# Patient Record
Sex: Female | Born: 1980 | Race: White | Hispanic: No | Marital: Married | Smoking: Former smoker
Health system: Southern US, Community
[De-identification: ages and names within clinical notes are randomized; demographics above are authoritative.]

## PROBLEM LIST (undated history)

## (undated) ENCOUNTER — Inpatient Hospital Stay (HOSPITAL_COMMUNITY): Payer: Self-pay

## (undated) DIAGNOSIS — O039 Complete or unspecified spontaneous abortion without complication: Secondary | ICD-10-CM

## (undated) DIAGNOSIS — R87619 Unspecified abnormal cytological findings in specimens from cervix uteri: Secondary | ICD-10-CM

## (undated) DIAGNOSIS — E7800 Pure hypercholesterolemia, unspecified: Secondary | ICD-10-CM

## (undated) DIAGNOSIS — O139 Gestational [pregnancy-induced] hypertension without significant proteinuria, unspecified trimester: Secondary | ICD-10-CM

## (undated) DIAGNOSIS — I10 Essential (primary) hypertension: Secondary | ICD-10-CM

## (undated) DIAGNOSIS — F419 Anxiety disorder, unspecified: Secondary | ICD-10-CM

## (undated) HISTORY — DX: Anxiety disorder, unspecified: F41.9

## (undated) HISTORY — DX: Pure hypercholesterolemia, unspecified: E78.00

## (undated) HISTORY — DX: Essential (primary) hypertension: I10

## (undated) HISTORY — PX: COLPOSCOPY: SHX161

## (undated) HISTORY — DX: Complete or unspecified spontaneous abortion without complication: O03.9

## (undated) HISTORY — DX: Gestational (pregnancy-induced) hypertension without significant proteinuria, unspecified trimester: O13.9

## (undated) HISTORY — PX: TOE SURGERY: SHX1073

## (undated) HISTORY — PX: TONSILLECTOMY: SUR1361

## (undated) HISTORY — DX: Unspecified abnormal cytological findings in specimens from cervix uteri: R87.619

## (undated) HISTORY — PX: CARPAL TUNNEL RELEASE: SHX101

## (undated) HISTORY — PX: TONSILLECTOMY AND ADENOIDECTOMY: SHX28

---

## 2014-01-16 ENCOUNTER — Encounter: Payer: Self-pay | Admitting: Obstetrics and Gynecology

## 2014-01-16 ENCOUNTER — Ambulatory Visit (INDEPENDENT_AMBULATORY_CARE_PROVIDER_SITE_OTHER): Payer: BC Managed Care – PPO | Admitting: Obstetrics and Gynecology

## 2014-01-16 VITALS — BP 138/82 | HR 70 | Resp 16 | Ht 62.0 in | Wt 229.5 lb

## 2014-01-16 DIAGNOSIS — Z01419 Encounter for gynecological examination (general) (routine) without abnormal findings: Secondary | ICD-10-CM

## 2014-01-16 DIAGNOSIS — R87619 Unspecified abnormal cytological findings in specimens from cervix uteri: Secondary | ICD-10-CM

## 2014-01-16 DIAGNOSIS — Z113 Encounter for screening for infections with a predominantly sexual mode of transmission: Secondary | ICD-10-CM

## 2014-01-16 HISTORY — DX: Unspecified abnormal cytological findings in specimens from cervix uteri: R87.619

## 2014-01-16 MED ORDER — NORETHINDRONE 0.35 MG PO TABS: 1.0000 | ORAL_TABLET | Freq: Every day | ORAL | Status: DC

## 2014-01-16 MED ORDER — DESOGESTREL-ETHINYL ESTRADIOL 0.15-0.02/0.01 MG (21/5) PO TABS: 1.0000 | ORAL_TABLET | Freq: Every day | ORAL | Status: DC

## 2014-01-16 NOTE — Patient Instructions (Signed)
Norethindrone tablets (contraception) What is this medicine? NORETHINDRONE (nor eth IN drone) is an oral contraceptive. The product contains a female hormone known as a progestin. It is used to prevent pregnancy. This medicine may be used for other purposes; ask your health care provider or pharmacist if you have questions. COMMON BRAND NAME(S): Camila, Errin , Heather, Jencycla, Jolivette , Lyza, Nor-QD, Nora-BE, Ortho Micronor What should I tell my health care provider before I take this medicine? They need to know if you have any of these conditions: -blood vessel disease or blood clots -breast, cervical, or vaginal cancer -diabetes -heart disease -kidney disease -liver disease -mental depression -migraine -seizures -stroke -vaginal bleeding -an unusual or allergic reaction to norethindrone, other medicines, foods, dyes, or preservatives -pregnant or trying to get pregnant -breast-feeding How should I use this medicine? Take this medicine by mouth with a glass of water. You may take it with or without food. Follow the directions on the prescription label. Take this medicine at the same time each day and in the order directed on the package. Do not take your medicine more often than directed. Contact your pediatrician regarding the use of this medicine in children. Special care may be needed. This medicine has been used in female children who have started having menstrual periods. A patient package insert for the product will be given with each prescription and refill. Read this sheet carefully each time. The sheet may change frequently. Overdosage: If you think you have taken too much of this medicine contact a poison control center or emergency room at once. NOTE: This medicine is only for you. Do not share this medicine with others. What if I miss a dose? Try not to miss a dose. Every time you miss a dose or take a dose late your chance of pregnancy increases. When 1 pill is missed  (even if only 3 hours late), take the missed pill as soon as possible and continue taking a pill each day at the regular time (use a back up method of birth control for the next 48 hours). If more than 1 dose is missed, use an additional birth control method for the rest of your pill pack until menses occurs. Contact your health care professional if more than 1 dose has been missed. What may interact with this medicine? Do not take this medicine with any of the following medications: -amprenavir or fosamprenavir -bosentan This medicine may also interact with the following medications: -antibiotics or medicines for infections, especially rifampin, rifabutin, rifapentine, and griseofulvin, and possibly penicillins or tetracyclines -aprepitant -barbiturate medicines, such as phenobarbital -carbamazepine -felbamate -modafinil -oxcarbazepine -phenytoin -ritonavir or other medicines for HIV infection or AIDS -St. John's wort -topiramate This list may not describe all possible interactions. Give your health care provider a list of all the medicines, herbs, non-prescription drugs, or dietary supplements you use. Also tell them if you smoke, drink alcohol, or use illegal drugs. Some items may interact with your medicine. What should I watch for while using this medicine? Visit your doctor or health care professional for regular checks on your progress. You will need a regular breast and pelvic exam and Pap smear while on this medicine. Use an additional method of birth control during the first cycle that you take these tablets. If you have any reason to think you are pregnant, stop taking this medicine right away and contact your doctor or health care professional. If you are taking this medicine for hormone related problems, it may take several   cycles of use to see improvement in your condition. This medicine does not protect you against HIV infection (AIDS) or any other sexually transmitted  diseases. What side effects may I notice from receiving this medicine? Side effects that you should report to your doctor or health care professional as soon as possible: -breast tenderness or discharge -pain in the abdomen, chest, groin or leg -severe headache -skin rash, itching, or hives -sudden shortness of breath -unusually weak or tired -vision or speech problems -yellowing of skin or eyes Side effects that usually do not require medical attention (report to your doctor or health care professional if they continue or are bothersome): -changes in sexual desire -change in menstrual flow -facial hair growth -fluid retention and swelling -headache -irritability -nausea -weight gain or loss This list may not describe all possible side effects. Call your doctor for medical advice about side effects. You may report side effects to FDA at 1-800-FDA-1088. Where should I keep my medicine? Keep out of the reach of children. Store at room temperature between 15 and 30 degrees C (59 and 86 degrees F). Throw away any unused medicine after the expiration date. NOTE: This sheet is a summary. It may not cover all possible information. If you have questions about this medicine, talk to your doctor, pharmacist, or health care provider.  2014, Elsevier/Gold Standard. (2012-07-14 16:41:35)  EXERCISE AND DIET:  We recommended that you start or continue a regular exercise program for good health. Regular exercise means any activity that makes your heart beat faster and makes you sweat.  We recommend exercising at least 30 minutes per day at least 3 days a week, preferably 4 or 5.  We also recommend a diet low in fat and sugar.  Inactivity, poor dietary choices and obesity can cause diabetes, heart attack, stroke, and kidney damage, among others.    ALCOHOL AND SMOKING:  Women should limit their alcohol intake to no more than 7 drinks/beers/glasses of wine (combined, not each!) per week. Moderation of  alcohol intake to this level decreases your risk of breast cancer and liver damage. And of course, no recreational drugs are part of a healthy lifestyle.  And absolutely no smoking or even second hand smoke. Most people know smoking can cause heart and lung diseases, but did you know it also contributes to weakening of your bones? Aging of your skin?  Yellowing of your teeth and nails?  CALCIUM AND VITAMIN D:  Adequate intake of calcium and Vitamin D are recommended.  The recommendations for exact amounts of these supplements seem to change often, but generally speaking 600 mg of calcium (either carbonate or citrate) and 800 units of Vitamin D per day seems prudent. Certain women may benefit from higher intake of Vitamin D.  If you are among these women, your doctor will have told you during your visit.    PAP SMEARS:  Pap smears, to check for cervical cancer or precancers,  have traditionally been done yearly, although recent scientific advances have shown that most women can have pap smears less often.  However, every woman still should have a physical exam from her gynecologist every year. It will include a breast check, inspection of the vulva and vagina to check for abnormal growths or skin changes, a visual exam of the cervix, and then an exam to evaluate the size and shape of the uterus and ovaries.  And after 33 years of age, a rectal exam is indicated to check for rectal cancers. We will  also provide age appropriate advice regarding health maintenance, like when you should have certain vaccines, screening for sexually transmitted diseases, bone density testing, colonoscopy, mammograms, etc.   MAMMOGRAMS:  All women over 65 years old should have a yearly mammogram. Many facilities now offer a "3D" mammogram, which may cost around $50 extra out of pocket. If possible,  we recommend you accept the option to have the 3D mammogram performed.  It both reduces the number of women who will be called back for  extra views which then turn out to be normal, and it is better than the routine mammogram at detecting truly abnormal areas.    COLONOSCOPY:  Colonoscopy to screen for colon cancer is recommended for all women at age 75.  We know, you hate the idea of the prep.  We agree, BUT, having colon cancer and not knowing it is worse!!  Colon cancer so often starts as a polyp that can be seen and removed at colonscopy, which can quite literally save your life!  And if your first colonoscopy is normal and you have no family history of colon cancer, most women don't have to have it again for 10 years.  Once every ten years, you can do something that may end up saving your life, right?  We will be happy to help you get it scheduled when you are ready.  Be sure to check your insurance coverage so you understand how much it will cost.  It may be covered as a preventative service at no cost, but you should check your particular policy.

## 2014-01-16 NOTE — Progress Notes (Signed)
Patient ID: Debra [name], female   DOB: [birth year], 33 y.o.   MRN: [id number] GYNECOLOGY VISIT  PCP:  [facility]  Referring provider:   HPI: 33 y.o.   Single  Caucasian  female   G0P0 with Patient's last menstrual period was 01/12/2014.   here for  AEX.  Happy with her OCP. No menstrual problems. Here for [place].  Works with a company that [employer].   Started OCPs for irregular menstruation and possible polycystic ovarian change noted on an ultrasound in [place].   Does periodic HIV testing.   Borderline HTN.  Not treated.  PCP checked glucose - normal. Cholesterol checked 2 years ago - normal.  Hgb:    PCP Urine:  PCP  GYNECOLOGIC HISTORY: Patient's last menstrual period was 01/12/2014. Sexually active:  yes Partner preference: female Contraception:  OCP's-Azurette, condoms.  Menopausal hormone therapy: n/a DES exposure:   no Blood transfusions:   no Sexually transmitted diseases:   no GYN procedures and prior surgeries:  no Last mammogram:   n/a              Last pap and high risk HPV testing:   06/2012 wnl:pt. Thinks they did HPV testing and was neg. History of abnormal pap smear:  no   OB History   Grav Para Term Preterm Abortions TAB SAB Ect Mult Living   0                LIFESTYLE: Exercise:    Attends gym          Tobacco:   no Alcohol:     3 drinks per week Drug use:  no  OTHER HEALTH MAINTENANCE: Tetanus/TDap:   12/2013 Gardisil:              no Influenza:            never Zostavax:            n/a  Bone density:     n/a Colonoscopy:     n/a  Cholesterol check:   Not recently  Family History  Problem Relation Age of Onset  . Thyroid disease Mother   . Osteoarthritis Mother   . Hypertension Father   . Hypertension Brother   . Rheum arthritis Maternal Grandmother     There are no active problems to display for this patient.  Past Medical History  Diagnosis Date  . Hypertension     Past Surgical History   Procedure Laterality Date  . Tonsillectomy and adenoidectomy      -age 33  . Toe surgery Bilateral     -as child--in [place]    ALLERGIES: Review of patient's allergies indicates no known allergies.  Current Outpatient Prescriptions  Medication Sig Dispense Refill  . AZURETTE 0.15-0.02/0.01 MG (21/5) tablet Take 1 tablet by mouth daily.       No current facility-administered medications for this visit.     ROS:  Pertinent items are noted in HPI.  SOCIAL HISTORY:  Solicitor.   PHYSICAL EXAMINATION:    BP 138/82  Pulse 70  Resp 16  Ht 5\' 2"  (1.575 m)  Wt 229 lb 8 oz (104.101 kg)  BMI 41.97 kg/m2  LMP 01/12/2014   Wt Readings from Last 3 Encounters:  01/16/14 229 lb 8 oz (104.101 kg)     Ht Readings from Last 3 Encounters:  01/16/14 5\' 2"  (1.575 m)    General appearance: alert, cooperative and appears stated age Head: Normocephalic, without  obvious abnormality, atraumatic Neck: no adenopathy, supple, symmetrical, trachea midline and thyroid not enlarged, symmetric, no tenderness/mass/nodules Lungs: clear to auscultation bilaterally Breasts: Inspection negative, No nipple retraction or dimpling, No nipple discharge or bleeding, No axillary or supraclavicular adenopathy, Normal to palpation without dominant masses Heart: regular rate and rhythm Abdomen: soft, non-tender; no masses,  no organomegaly Extremities: extremities normal, atraumatic, no cyanosis or edema Skin: Skin color, texture, turgor normal. No rashes or lesions Lymph nodes: Cervical, supraclavicular, and axillary nodes normal. No abnormal inguinal nodes palpated Neurologic: Grossly normal  Pelvic: External genitalia:  no lesions              Urethra:  normal appearing urethra with no masses, tenderness or lesions              Bartholins and Skenes: normal                 Vagina: normal appearing vagina with normal color and discharge, no lesions              Cervix: normal appearance               Pap and high risk HPV testing done: yes.            Bimanual Exam:  Uterus:  uterus is normal size, shape, consistency and nontender                                      Adnexa: normal adnexa in size, nontender and no masses                                      Rectovaginal: Confirms                                      Anus:  normal sphincter tone, no lesions  ASSESSMENT  Normal gynecologic exam. Desire for STD testing. Borderline HTN.  Polycystic ovarian disease by history.   PLAN  Mammogram recommended at age 33.  Pap smear and high risk HPV testing performed today.  Counseled on Calcium and vitamin D intake, exercise. STD testing - HIV, RPR, Hep B, and GC/CT. Switch from combined OCP to Micronor POP.  3 packs and 3 refills. Instructed in used.  Recheck in  2 - 3 months.  Return annually or prn   An After Visit Summary was printed and given to the patient.

## 2014-01-17 LAB — GC/CHLAMYDIA PROBE AMP, URINE
Chlamydia, Swab/Urine, PCR: NEGATIVE
GC Probe Amp, Urine: NEGATIVE

## 2014-01-17 LAB — STD PANEL
HIV: NONREACTIVE
Hepatitis B Surface Ag: NEGATIVE

## 2014-01-18 LAB — IPS PAP TEST WITH HPV

## 2014-01-22 ENCOUNTER — Telehealth: Payer: Self-pay | Admitting: Obstetrics and Gynecology

## 2014-01-22 DIAGNOSIS — R87810 Cervical high risk human papillomavirus (HPV) DNA test positive: Secondary | ICD-10-CM

## 2014-01-22 NOTE — Telephone Encounter (Signed)
Pt given message below from Dr.Silva. Agrees to schedule colpo. Scheduled for 4/15 at 1500 (patient states she will be out of [place] until the 13th). pre-procedure instructions given. Motrin instructions given. Motrin=Advil=Ibuprofen Can take 800 mg (Can purchase over the counter, you will need four 200 mg pills). Take with food. Make sure to eat a meal and drink fluids prior to appointment.  Patient is on OCP. Advised of cancellation policy. Patient will call back if starts period or any other concerns or questions.  Routing to provider for final review. Patient agreeable to disposition. Will close encounter

## 2014-01-22 NOTE — Telephone Encounter (Signed)
Message copied by Jannet AskewHINES, KAITLYN E on Tue Jan 22, 2014  2:32 PM ------      Message from: AMUNDSON DE Gwenevere GhaziARVALHO E SILVA, BROOK E      Created: Mon Jan 21, 2014  6:05 PM       French Anaracy,             Please inform patient of her pap and HPV status.      Pap normal and high risk HPV was positive.      She will need a colposcopy with me to evaluate further.  Biopsies may be needed at the time of the colposcopy.             Lum BabeAmanda,            FYI.       ------

## 2014-01-22 NOTE — Telephone Encounter (Signed)
Thank you for discussing this with the patient. She will need to be seen for the colposcopy when she is not on her menstruation.  I will close the encounter.

## 2014-01-23 ENCOUNTER — Telehealth: Payer: Self-pay | Admitting: Obstetrics and Gynecology

## 2014-01-23 NOTE — Telephone Encounter (Signed)
Spoke with patient. Advised of $25 copay for colpo visit. Patient agreeable

## 2014-02-20 ENCOUNTER — Encounter: Payer: Self-pay | Admitting: Obstetrics and Gynecology

## 2014-02-20 ENCOUNTER — Ambulatory Visit (INDEPENDENT_AMBULATORY_CARE_PROVIDER_SITE_OTHER): Payer: BC Managed Care – PPO | Admitting: Obstetrics and Gynecology

## 2014-02-20 VITALS — BP 142/88 | HR 100 | Ht 62.0 in | Wt 233.0 lb

## 2014-02-20 DIAGNOSIS — R87810 Cervical high risk human papillomavirus (HPV) DNA test positive: Secondary | ICD-10-CM

## 2014-02-20 NOTE — Progress Notes (Signed)
Subjective:     Patient ID: Debra [name], female   DOB: 19-Oct-1981, 33 y.o.   MRN: [id number]  HPI  Patient is here for colposcopy.   LMP 02/04/14. Patient is on Camilla OCPs which she started 1 1/2 weeks ago. No chance of pregnancy.  Using condoms as back up protection.   Pap on 01/16/14 - WNL, positive HR HPV. No history of abnormal pap smear.  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   Review of Systems     Objective:   Physical Exam  Genitourinary:        Colposcopy  Consent for procedure. Speculum placed in vagina.  Had to hold pressure against speculum in order to see cervix - long vagina and sidewalls collapsing in.  Multiple speculums tired and regular pederson was best.  Acetic acid placed.  Colposcopy satisfactory. ECC performed and sent to pathology. Biopsy of 6 o'clock sent to pathology. Minimal bleeding.  Speculum removed.   Acetic acid to the vulva.  HPV changes noted.  No biopsy taken.  No complications to colposcopy.     Assessment:     Positive HR HPV and normal pap.  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     Plan:     Await biopsy results.  If LEEP is needed, I recommend doing this in a hospital setting to improve visualization and ease of procedure. LEEP briefly discussed.     After visit summary to patient.

## 2014-02-20 NOTE — Patient Instructions (Signed)
Colposcopy, Care After  Refer to this sheet in the next few weeks. These instructions provide you with information on caring for yourself after your procedure. Your health care provider may also give you more specific instructions. Your treatment has been planned according to current medical practices, but problems sometimes occur. Call your health care provider if you have any problems or questions after your procedure.  WHAT TO EXPECT AFTER THE PROCEDURE   After your procedure, it is typical to have the following:  · Cramping. This often goes away in a few minutes.  · Soreness. This may last for 2 days.  · Lightheadedness. Lie down for a few minutes if this occurs.  You may also have some bleeding or dark discharge for a few days. You may need to wear a sanitary pad during this time.  HOME CARE INSTRUCTIONS  · Avoid sex, douching, and using tampons for 3 days or as directed by your health care provider.  · Only take over-the-counter or prescription medicines as directed by your health care provider. Do not take aspirin because it can cause bleeding.  · Continue to take birth control pills if you are on them.  · Not all test results are available during your visit. If your test results are not back during the visit, make an appointment with your health care provider to find out the results. Do not assume everything is normal if you have not heard from your health care provider or the medical facility. It is important for you to follow up on all of your test results.  · Follow your health care provider's advice regarding activity, follow-up visits, and follow-up Pap tests.  SEEK MEDICAL CARE IF:  · You develop a rash.  · You have problems with your medicine.  SEEK IMMEDIATE MEDICAL CARE IF:  · You are bleeding heavily or are passing blood clots.  · You have a fever.  · You have abnormal vaginal discharge.  · You are having cramps that do not go away after taking your pain medicine.  · You feel lightheaded, dizzy, or  faint.  · You have stomach pain.  Document Released: 08/15/2013 Document Reviewed: 05/24/2013  ExitCare® Patient Information ©2014 [employer].

## 2014-02-22 LAB — IPS OTHER TISSUE BIOPSY

## 2014-04-26 ENCOUNTER — Encounter: Payer: Self-pay | Admitting: Obstetrics and Gynecology

## 2014-04-26 ENCOUNTER — Ambulatory Visit (INDEPENDENT_AMBULATORY_CARE_PROVIDER_SITE_OTHER): Payer: BC Managed Care – PPO | Admitting: Obstetrics and Gynecology

## 2014-04-26 VITALS — BP 122/76 | HR 76 | Ht 62.0 in | Wt 231.0 lb

## 2014-04-26 DIAGNOSIS — Z30430 Encounter for insertion of intrauterine contraceptive device: Secondary | ICD-10-CM

## 2014-04-26 DIAGNOSIS — Z3041 Encounter for surveillance of contraceptive pills: Secondary | ICD-10-CM

## 2014-04-26 NOTE — Addendum Note (Signed)
Addended by: Annamaria HellingAMUNDSON DE CARVALHO E SILVA, BROOK E on: 04/26/2014 09:32 AM   Modules accepted: Orders

## 2014-04-26 NOTE — Progress Notes (Signed)
Patient ID: Debra [name], female   DOB: [birth year], 33 y.o.   MRN: [id number] GYNECOLOGY  VISIT   HPI: 33 y.o.   Single  Caucasian  female   G0P0 with Patient's last menstrual period was 04/19/2014.   here for  Follow visit to review new OCP's.  Took Micronor now for three months. Pt. Began Micronor a few months ago and is having 2 cycles per month with increased cramping.  Having bleeding every 2 weeks and having pain.  Took pills on time.  Gained weight - about 5 pounds, but lost it again.   Took Azurette in the past.  Liked this better but blood pressure was elevated slightly.   Not sexually active for 2 - 3  months.   GYNECOLOGIC HISTORY: Patient's last menstrual period was 04/19/2014. Contraception:  OCP's--Micronor  Menopausal hormone therapy: n/a        OB History   Grav Para Term Preterm Abortions TAB SAB Ect Mult Living   0                  There are no active problems to display for this patient.   Past Medical History  Diagnosis Date  . Hypertension   . Abnormal Pap smear of cervix 01-16-14    -pap nl but Pos.HR HPV/Colpo 02-20-14--LSIL--needs repeat pap 4960yr.    Past Surgical History  Procedure Laterality Date  . Tonsillectomy and adenoidectomy      -age 33  . Toe surgery Bilateral     -as child[place]    Current Outpatient Prescriptions  Medication Sig Dispense Refill  . Melatonin 3 MG CAPS Take 3 mg by mouth at bedtime.      . norethindrone (MICRONOR,CAMILA,ERRIN) 0.35 MG tablet Take 1 tablet (0.35 mg total) by mouth daily.  3 Package  3   No current facility-administered medications for this visit.     ALLERGIES: Review of patient's allergies indicates no known allergies.  Family History  Problem Relation Age of Onset  . Thyroid disease Mother   . Osteoarthritis Mother   . Hypertension Father   . Hypertension Brother   . Rheum arthritis Maternal Grandmother     History   Social History  . Marital Status: Single    Spouse Name:  N/A    Number of Children: N/A  . Years of Education: N/A   Occupational History  . Not on file.   Social History Main Topics  . Smoking status: Never Smoker   . Smokeless tobacco: Not on file  . Alcohol Use: 1.5 oz/week    3 drink(s) per week  . Drug Use: No  . Sexual Activity: Yes    Partners: Male    Birth Control/ Protection: OCP     Comment: Micronor   Other Topics Concern  . Not on file   Social History Narrative  . No narrative on file    ROS:  Pertinent items are noted in HPI.  PHYSICAL EXAMINATION:    BP 122/76  Pulse 76  Ht 5\' 2"  (1.575 m)  Wt 231 lb (104.781 kg)  BMI 42.24 kg/m2  LMP 04/19/2014     General appearance: alert, cooperative and appears stated age   ASSESSMENT  Irregular menses on Camilla. Mildly elevated blood pressure on Azurette which was discontinued. History of LGSIL.  PLAN  I discussed Skyla and gave handout as an option for contraception that will not affect blood pressure and may have a better profile for her cycles.  Risks and  benefits discussed.  She understands that her cycles could be irregular at the beginning of [facility]. I explained the insertion process with cytotec and paracervical block.  Will precert and patient will do her own research. If Christean GriefSkyla is not an option, will consider restarting Azurette and have her PCP follow her blood pressure.  Return prn and for follow up pap next spring 2016.    An After Visit Summary was printed and given to the patient.  __15____ minutes face to face time of which over 50% was spent in counseling.

## 2014-05-14 ENCOUNTER — Telehealth: Payer: Self-pay | Admitting: Obstetrics and Gynecology

## 2014-05-14 NOTE — Telephone Encounter (Signed)
Spoke with patient. Advised that per benefits quote received, IUD and insertion is covered at 100%. There will be 0 patient liability. Patient is to call within the first 5 days of her cycle to schedule insertion. °

## 2014-05-23 ENCOUNTER — Other Ambulatory Visit: Payer: Self-pay | Admitting: Obstetrics and Gynecology

## 2014-05-24 ENCOUNTER — Other Ambulatory Visit: Payer: Self-pay | Admitting: Obstetrics and Gynecology

## 2014-05-24 ENCOUNTER — Telehealth: Payer: Self-pay | Admitting: Obstetrics and Gynecology

## 2014-05-24 NOTE — Telephone Encounter (Signed)
Last refills 01/16/14 #3 packs/ 3 refills Pt should have enough refills until 01/2015.  - Called pharmacy. - Pt notified.

## 2014-05-24 NOTE — Telephone Encounter (Signed)
Micronor was sent on 01/16/14 # 3 packs, 3 refills Nora-Be is the generic and the pt has enough rx to cover until next AEX  Encounter closed

## 2014-05-24 NOTE — Telephone Encounter (Signed)
Patient request refill of:  norethindrone (MICRONOR,CAMILA,ERRIN) 0.35 MG tablet  Take 1 tablet (0.35 mg total) by mouth daily., Starting 01/16/2014, Until Discontinued, Normal, Last Dose: Not Recorded  Pharmacy on file.

## 2014-06-03 ENCOUNTER — Telehealth: Payer: Self-pay | Admitting: Obstetrics and Gynecology

## 2014-06-03 NOTE — Telephone Encounter (Signed)
Debra [name] is a 33 y.o. female Patient currently on Micronor since 01/2014. Initially, cycles were irregular, but they have since become regular. Patient has not missed any pills or taken any late. LMP 05/20/14 which was normal for patient. Now having vaginal bleeding, changing pad q 6-8 hours. Patient also states that she has started to have increased bleeding after intercourse that happens each time and has bleeding that is more than spotting that requires the use of a pad. Advised office visit with Dr. Edward JollySilva recommended. Patient is agreeable. Scheduled office visit for 06/05/14 at 1130 with Dr. Edward JollySilva.  I advised I would call back if any additional instructions from provider. Patient is agreeable to plan and will call back if condition changes or worsens.  Routing to provider for final review. Patient agreeable to disposition. Will close encounter

## 2014-06-03 NOTE — Telephone Encounter (Signed)
Patient calling to speak with nurse about "problems with bleeding." Patient having menstrual bleeding between cycles.

## 2014-06-03 NOTE — Telephone Encounter (Signed)
Please do a pregnancy test and call with result.

## 2014-06-04 NOTE — Telephone Encounter (Signed)
Spoke with patient and message from Dr. Edward JollySilva given.  Patient will complete home pregnancy test today and call us back with results.  Has office visit scheduled with Dr. Edward JollySilva for 06/05/14.

## 2014-06-05 ENCOUNTER — Ambulatory Visit (INDEPENDENT_AMBULATORY_CARE_PROVIDER_SITE_OTHER): Payer: BC Managed Care – PPO | Admitting: Obstetrics and Gynecology

## 2014-06-05 ENCOUNTER — Encounter: Payer: Self-pay | Admitting: Obstetrics and Gynecology

## 2014-06-05 VITALS — BP 120/76 | HR 64 | Ht 62.0 in | Wt 222.0 lb

## 2014-06-05 DIAGNOSIS — N920 Excessive and frequent menstruation with regular cycle: Secondary | ICD-10-CM

## 2014-06-05 DIAGNOSIS — N921 Excessive and frequent menstruation with irregular cycle: Secondary | ICD-10-CM

## 2014-06-05 DIAGNOSIS — N926 Irregular menstruation, unspecified: Secondary | ICD-10-CM

## 2014-06-05 LAB — CBC
HCT: 41.5 % (ref 36.0–46.0)
Hemoglobin: 14.3 g/dL (ref 12.0–15.0)
MCH: 30.4 pg (ref 26.0–34.0)
MCHC: 34.5 g/dL (ref 30.0–36.0)
MCV: 88.3 fL (ref 78.0–100.0)
PLATELETS: 413 10*3/uL — AB (ref 150–400)
RBC: 4.7 MIL/uL (ref 3.87–5.11)
RDW: 13 % (ref 11.5–15.5)
WBC: 8.6 10*3/uL (ref 4.0–10.5)

## 2014-06-05 NOTE — Progress Notes (Signed)
Patient ID: Debra [name], female   DOB: [birth year], 33 y.o.   MRN: [id number] GYNECOLOGY VISIT  PCP:   None  Referring provider:   None   HPI: 33 y.o.   Single  Caucasian  female   G0P0 with Patient's last menstrual period was 05/14/2014.   here for irregular menses.  Taking on Micronor for 4 -5 months.  No missed pills.  No history of irregular menses on prior OCP - [facility]. Patient is really worried about the bleeding and wants to understand the cause of the bleeding and then consider a [place] or Mirena IUD.  Can have post coital bleeding. Did a home UPT yesterday and it was negative.   Brother is missing.  He is 24 and has depression.  Patient having anxiety and taking Zoloft and Vistaril.  Also in counseling.   GYNECOLOGIC HISTORY: Patient's last menstrual period was 05/14/2014. Sexually active:  yes Partner preference:  Female Contraception:   Micronor Menopausal hormone therapy:  n/a DES exposure:   no Blood transfusions:  no    Sexually transmitted diseases: no    GYN procedures and prior surgeries:  no Last mammogram: n/a              Last pap and high risk HPV testing:   01/16/14, WNL, positive HR HPV.  Colpo 4/15 - LGSIL.  History of abnormal pap smear:  no   OB History   Grav Para Term Preterm Abortions TAB SAB Ect Mult Living   0                LIFESTYLE: Exercise:               Tobacco:  Alcohol: Drug use:    There are no active problems to display for this patient.   Past Medical History  Diagnosis Date  . Hypertension   . Abnormal Pap smear of cervix 01-16-14    -pap nl but Pos.HR HPV/Colpo 02-20-14--LSIL--needs repeat pap 7yr.    Past Surgical History  Procedure Laterality Date  . Tonsillectomy and adenoidectomy      -age 41  . Toe surgery Bilateral     -as child--in [place]    Current Outpatient Prescriptions  Medication Sig Dispense Refill  . hydrOXYzine (ATARAX/VISTARIL) 25 MG tablet Take 25 mg by mouth 3 (three) times daily as  needed.      . Melatonin 3 MG CAPS Take 3 mg by mouth at bedtime.      . norethindrone (MICRONOR,CAMILA,ERRIN) 0.35 MG tablet Take 1 tablet (0.35 mg total) by mouth daily.  3 Package  3  . sertraline (ZOLOFT) 100 MG tablet Take 100 mg by mouth daily.       No current facility-administered medications for this visit.     ALLERGIES: Review of patient's allergies indicates no known allergies.  Family History  Problem Relation Age of Onset  . Thyroid disease Mother   . Osteoarthritis Mother   . Hypertension Father   . Hypertension Brother   . Rheum arthritis Maternal Grandmother     History   Social History  . Marital Status: Single    Spouse Name: N/A    Number of Children: N/A  . Years of Education: N/A   Occupational History  . Not on file.   Social History Main Topics  . Smoking status: Never Smoker   . Smokeless tobacco: Not on file  . Alcohol Use: 1.5 oz/week    3 drink(s) per week  . Drug Use: No  .  Sexual Activity: Yes    Partners: Male    Birth Control/ Protection: OCP     Comment: Micronor   Other Topics Concern  . Not on file   Social History Narrative  . No narrative on file    ROS:  Pertinent items are noted in HPI.  PHYSICAL EXAMINATION:    BP 120/76  Pulse 64  Ht 5\' 2"  (1.575 m)  Wt 222 lb (100.699 kg)  BMI 40.59 kg/m2  LMP 05/14/2014   Wt Readings from Last 3 Encounters:  06/05/14 222 lb (100.699 kg)  04/26/14 231 lb (104.781 kg)  02/20/14 233 lb (105.688 kg)     Ht Readings from Last 3 Encounters:  06/05/14 5\' 2"  (1.575 m)  04/26/14 5\' 2"  (1.575 m)  02/20/14 5\' 2"  (1.575 m)    General appearance: alert, cooperative and appears stated age  ASSESSMENT  Irregular menses on Ortho Micronor. Anxiety.  HTN.  PLAN  Continue Micronor while completes evaluation.  TSH.  Return for pelvic ultrasound.   An After Visit Summary was printed and given to the patient.  25 minutes face to face time of which over 50% was spent in  counseling.

## 2014-06-06 LAB — TSH: TSH: 1.458 u[IU]/mL (ref 0.350–4.500)

## 2014-06-17 ENCOUNTER — Telehealth: Payer: Self-pay | Admitting: Obstetrics and Gynecology

## 2014-06-17 NOTE — Telephone Encounter (Signed)
Left message for patient to call back. Need to go over benefits and schedule PUS °

## 2014-06-18 NOTE — Telephone Encounter (Signed)
Returning a call to Rodant[place]ica? I see tc note from [place].

## 2014-06-19 NOTE — Telephone Encounter (Signed)
Spoke with patient. Advised that her oop expense for PUS will be $299.23. Patient will review her finances and will call back when she is ready to schedule.

## 2014-07-02 NOTE — Telephone Encounter (Signed)
Dr. Edward Jolly,  Okay to schedule patient for IUD [facility]) insertion prior to having Pelvic ultrasound done?

## 2014-07-02 NOTE — Telephone Encounter (Signed)
Patient calling to report she started her menstrual cycle today and needs to schedule her IUD.

## 2014-07-03 NOTE — Telephone Encounter (Signed)
It makes more sense medically to proceed with the pelvic ultrasound first.  Do we have an opening for next week?

## 2014-07-03 NOTE — Telephone Encounter (Signed)
Spoke with patient. Discussed message from Dr. Edward Jolly and need to proceed with ultrasound prior to placing IUD.  Patient feels that irregular bleeding is r/t changing to progesterone only pills and that cost of ultrasound was a factor.  I advised that pelvic ultrasound will need to be done to evaluate prior to scheduling IUD insert.  Patient agreeable to scheduling pelvic ultrasound. Declines appointment for 9/3 and requested 9/10 for pelvic ultrasound appointment with Dr. Edward Jolly.  Pelvic U/S scheduled and patient aware/agreeable to time.  Patient verbalized understanding of the U/S appointment cancellation policy. Advised will need to cancel or reschedule within 72 business hours of appointment (3 business days) or will have $100.00 late cancellation fee placed to account.  Ultrasound instructions mailed to patient as well.  Routing to provider for final review. Patient agreeable to disposition. Will close encounter

## 2014-07-18 ENCOUNTER — Ambulatory Visit (INDEPENDENT_AMBULATORY_CARE_PROVIDER_SITE_OTHER): Payer: BC Managed Care – PPO

## 2014-07-18 ENCOUNTER — Telehealth: Payer: Self-pay | Admitting: Obstetrics and Gynecology

## 2014-07-18 ENCOUNTER — Ambulatory Visit (INDEPENDENT_AMBULATORY_CARE_PROVIDER_SITE_OTHER): Payer: BC Managed Care – PPO | Admitting: Obstetrics and Gynecology

## 2014-07-18 ENCOUNTER — Encounter: Payer: Self-pay | Admitting: Obstetrics and Gynecology

## 2014-07-18 VITALS — BP 138/78 | HR 76 | Ht 62.0 in | Wt 225.0 lb

## 2014-07-18 DIAGNOSIS — N920 Excessive and frequent menstruation with regular cycle: Secondary | ICD-10-CM

## 2014-07-18 DIAGNOSIS — N921 Excessive and frequent menstruation with irregular cycle: Secondary | ICD-10-CM

## 2014-07-18 DIAGNOSIS — N926 Irregular menstruation, unspecified: Secondary | ICD-10-CM

## 2014-07-18 MED ORDER — MISOPROSTOL 200 MCG PO TABS: ORAL_TABLET | ORAL | Status: DC

## 2014-07-18 NOTE — Telephone Encounter (Signed)
Patient states that she has an irregular cycle and is not sure when she will have her period again. Would like to know about scheduling for IUD.  Dr.Silva, okay to have patient come in for blood pregnancy test day before insertion?

## 2014-07-18 NOTE — Telephone Encounter (Signed)
I already put orders in for the patient to do a quant beta HCG and then have the IUD placed two days later.  She will abstain during that time.  She is to stay on the Micronor until the IUD is inserted.

## 2014-07-18 NOTE — Progress Notes (Addendum)
Subjective  Patient is here today for a pelvic ultrasound for irregular bleeding on OrthoMicronor.  Patient concerned. Cannot really tell when she is having her period or not.  Hgb 14.3 in July.  No prior abnormal bleeding on combined OCPs. Stopped this due to HTN.  Patient is interested in [place] IUD.   Off antidepressants now.  Less stressed. Missing brother is in [place].   Objective  See ultrasound images and report below - reviewed with patient  Uterus normal with EMS 5.35 mm. No masses. Ovaries with peripheral follicles suggestive of PCOS.  No free fluid.     Assessment  Irregular bleeding on Micronor.  Normal uterus and endometrium.  Potential polycystic ovaries.   Plan  Reassurance given.  OK to proceed with Skyla or Mirena IUD.  Will have patient come to office for quant BHCG and then proceed with insertion 2 days later.  Will abstain during this time.   Discussed polycystic ovarian syndrome with patient.   15 minutes face to face time of which over 50% was spent in counseling.   After visit summary to patient.

## 2014-07-18 NOTE — Telephone Encounter (Signed)
Spoke with patient. Advised that Mirena IUD insertion and device will be covered at 100% of allowable. Patient agreeable.

## 2014-07-19 NOTE — Telephone Encounter (Signed)
Spoke with patient. Appointment scheduled for 10/14 at 8:30am for lab. IUD insertion scheduled for 10/16 at 3pm with Dr.Silva. Patient agreeable to both appointment dates and times. Aware to remain abstinent after hcg and continue with micronor until iud placement. Cytotec instructions given. Take one tablet the night before procedure and one tablet the morning of procedure. Pre procedure instructions given.  Motrin instructions given. Motrin=Advil=Ibuprofen, 800 mg one hour before appointment. Eat a meal and hydrate well before appointment. Advised to cancel appointment need 72 hours notice or late cancellation fee of $100 will be placed to patient's account. Patient agreeable.  Routing to provider for final review. Patient agreeable to disposition. Will close encounter

## 2014-07-23 ENCOUNTER — Other Ambulatory Visit: Payer: BC Managed Care – PPO

## 2014-08-14 ENCOUNTER — Other Ambulatory Visit: Payer: Self-pay | Admitting: Orthopaedic Surgery

## 2014-08-14 DIAGNOSIS — M25511 Pain in right shoulder: Secondary | ICD-10-CM

## 2014-08-21 ENCOUNTER — Other Ambulatory Visit (INDEPENDENT_AMBULATORY_CARE_PROVIDER_SITE_OTHER): Payer: BC Managed Care – PPO

## 2014-08-21 DIAGNOSIS — N926 Irregular menstruation, unspecified: Secondary | ICD-10-CM

## 2014-08-22 LAB — HCG, QUANTITATIVE, PREGNANCY: hCG, Beta Chain, Quant, S: <2 m[IU]/mL

## 2014-08-23 ENCOUNTER — Telehealth: Payer: Self-pay | Admitting: Obstetrics and Gynecology

## 2014-08-23 ENCOUNTER — Encounter: Payer: Self-pay | Admitting: Obstetrics and Gynecology

## 2014-08-23 ENCOUNTER — Ambulatory Visit (INDEPENDENT_AMBULATORY_CARE_PROVIDER_SITE_OTHER): Payer: BC Managed Care – PPO | Admitting: Obstetrics and Gynecology

## 2014-08-23 ENCOUNTER — Ambulatory Visit
Admission: RE | Admit: 2014-08-23 | Discharge: 2014-08-23 | Disposition: A | Discharge status: Home / Self Care | Payer: BC Managed Care – PPO | Source: Ambulatory Visit | Attending: Orthopaedic Surgery | Admitting: Orthopaedic Surgery

## 2014-08-23 VITALS — BP 120/70 | HR 92 | Resp 18 | Ht 62.0 in | Wt 228.0 lb

## 2014-08-23 DIAGNOSIS — Z30430 Encounter for insertion of intrauterine contraceptive device: Secondary | ICD-10-CM

## 2014-08-23 DIAGNOSIS — M25511 Pain in right shoulder: Secondary | ICD-10-CM

## 2014-08-23 DIAGNOSIS — N926 Irregular menstruation, unspecified: Secondary | ICD-10-CM

## 2014-08-23 NOTE — Telephone Encounter (Signed)
Pt needs a 5 week iud recheck with dr Edward JollySilva.

## 2014-08-23 NOTE — Patient Instructions (Signed)

## 2014-08-23 NOTE — Progress Notes (Signed)
GYNECOLOGY  VISIT   HPI: 33 y.o.   Single  Caucasian  female   G0P0 with Patient's last menstrual period was 08/21/2014.   here for   IUD Insertion. Still with a lot of breakthrough bleeding on Camilla. Has HTN so planning on Mirena IUD today.  Has ultrasound showing peripheral ovarian follicles suggestive of PCOS.  Normal uterus.   Took Cytotec last hs and the am and Ibuprofen prior to procedure.   Quant HCG 08/20/14 - negative.  Moving in a couple of weeks to a new apartment.  Really happy to have her own loft apartment.  Having MRI (of shoulder) today.   GYNECOLOGIC HISTORY: Patient's last menstrual period was 08/21/2014. Contraception:   Camila Menopausal hormone therapy: None        OB History   Grav Para Term Preterm Abortions TAB SAB Ect Mult Living   0                  There are no active problems to display for this patient.   Past Medical History  Diagnosis Date  . Hypertension   . Abnormal Pap smear of cervix 01-16-14    -pap nl but Pos.HR HPV/Colpo 02-20-14--LSIL--needs repeat pap 4133yr.    Past Surgical History  Procedure Laterality Date  . Tonsillectomy and adenoidectomy      -age 33  . Toe surgery Bilateral     -as child[place]in [place]    Current Outpatient Prescriptions  Medication Sig Dispense Refill  . Melatonin 3 MG CAPS Take 3 mg by mouth at bedtime.      . misoprostol (CYTOTEC) 200 MCG tablet Take one table (200 mg) the night before the procedure and then one the morning of procedure.  2 tablet  0  . norethindrone (MICRONOR,CAMILA,ERRIN) 0.35 MG tablet Take 1 tablet (0.35 mg total) by mouth daily.  3 Package  3   No current facility-administered medications for this visit.     ALLERGIES: Review of patient's allergies indicates no known allergies.  Family History  Problem Relation Age of Onset  . Thyroid disease Mother   . Osteoarthritis Mother   . Hypertension Father   . Hypertension Brother   . Rheum arthritis Maternal Grandmother      History   Social History  . Marital Status: Single    Spouse Name: N/A    Number of Children: N/A  . Years of Education: N/A   Occupational History  . Not on file.   Social History Main Topics  . Smoking status: Never Smoker   . Smokeless tobacco: Never Used  . Alcohol Use: 1.5 oz/week    3 drink(s) per week  . Drug Use: No  . Sexual Activity: Yes    Partners: Male    Birth Control/ Protection: OCP     Comment: Micronor   Other Topics Concern  . Not on file   Social History Narrative  . No narrative on file    ROS:  Pertinent items are noted in HPI.  PHYSICAL EXAMINATION:    BP 120/70  Pulse 92  Resp 18  Ht 5\' 2"  (1.575 m)  Wt 228 lb (103.42 kg)  BMI 41.69 kg/m2  LMP 08/21/2014     General appearance: alert, cooperative and appears stated age  Pelvic: External genitalia:  no lesions              Urethra:  normal appearing urethra with no masses, tenderness or lesions  Bartholins and Skenes: normal                 Vagina: normal appearing vagina with normal color and discharge, no lesions              Cervix: normal appearance.  Small amount of menstrual blood.                    Bimanual Exam:  Uterus:  uterus is normal size, shape, consistency and nontender                                      Adnexa: normal adnexa in size, nontender and no masses   Procedure - Mirena IUD insertion - Lot number __[id number], Expiration ____04/18________      Consent for procedure. Speculum placed in vagina. Sterile prep of cervix with Hibiclens.  Paracervical block with 10 cc 1% Lidocaine - Lot number ___[id number]_________, Expiration ___6/1/16_________ Uterus sounded to almost 8 cm.  Mirena IUD placed without difficulty. Strings trimmed. No complications.  Minimal EBL. Tolerated well.                               ASSESSMENT  Encounter for Mirena insertion.  Breakthrough bleeding on Camilla.  Negative quant beta HCG. Possible PCOS.   HTN.   PLAN  Instructions and precautions given.  Recheck in 5 weeks.    An After Visit Summary was printed and given to the patient.  __15____ minutes face to face time of which over 50% was spent in counseling.

## 2014-08-26 NOTE — Telephone Encounter (Signed)
Pt will not be able to come in 11/18 at 945. Says she needs early morning, around lunch time or late afternoon.

## 2014-08-26 NOTE — Telephone Encounter (Signed)
Debra [name], please notify patient, 09-25-14 at 945. Appointment is entered. If this doesn't work for her, she can wait til the week after Thanksgiving if she prefers as long as she feels ok and isnt having any issue. If she has questions, have her call us.

## 2014-08-27 NOTE — Telephone Encounter (Signed)
Spoke with patient. Patient is not having any current concerns or problems with IUD. Patient is aware will need to be seen sooner if something changes. Appointment rescheduled to 12/3 at 8am with Dr.Silva. Patient is agreeable to date and time.  Routing to provider for final review. Patient agreeable to disposition. Will close encounter

## 2014-09-25 ENCOUNTER — Ambulatory Visit: Payer: BC Managed Care – PPO | Admitting: Obstetrics and Gynecology

## 2014-10-10 ENCOUNTER — Encounter: Payer: Self-pay | Admitting: Obstetrics and Gynecology

## 2014-10-10 ENCOUNTER — Ambulatory Visit (INDEPENDENT_AMBULATORY_CARE_PROVIDER_SITE_OTHER): Payer: BC Managed Care – PPO | Admitting: Obstetrics and Gynecology

## 2014-10-10 VITALS — BP 124/70 | HR 76 | Ht 62.0 in | Wt 231.2 lb

## 2014-10-10 DIAGNOSIS — Z30431 Encounter for routine checking of intrauterine contraceptive device: Secondary | ICD-10-CM

## 2014-10-10 NOTE — Progress Notes (Signed)
Patient ID: Debra [name], female   DOB: 1981-03-04, 33 y.o.   MRN: [id number] GYNECOLOGY  VISIT   HPI: 33 y.o.   Single  Caucasian  female   G0P0 with Patient's last menstrual period was 08/21/2014 (exact date).   here for 5 week follow up from Mirena IUD insertion.   Some cramping for one week, now occasional.  Bleeding almost every other day.  No reports of strings being uncomfortable with intercourse.  GYNECOLOGIC HISTORY: Patient's last menstrual period was 08/21/2014 (exact date). Contraception:  Mirena IUD  Menopausal hormone therapy: n/a        OB History    Gravida Para Term Preterm AB TAB SAB Ectopic Multiple Living   0                  There are no active problems to display for this patient.   Past Medical History  Diagnosis Date  . Hypertension   . Abnormal Pap smear of cervix 01-16-14    -pap nl but Pos.HR HPV/Colpo 02-20-14--LSIL--needs repeat pap 5666yr.    Past Surgical History  Procedure Laterality Date  . Tonsillectomy and adenoidectomy      -age 33  . Toe surgery Bilateral     -as child[place]in [place]    Current Outpatient Prescriptions  Medication Sig Dispense Refill  . levonorgestrel (MIRENA) 20 MCG/24HR IUD 1 each by Intrauterine route once.    . Melatonin 3 MG CAPS Take 3 mg by mouth at bedtime.     No current facility-administered medications for this visit.     ALLERGIES: Review of patient's allergies indicates no known allergies.  Family History  Problem Relation Age of Onset  . Thyroid disease Mother   . Osteoarthritis Mother   . Hypertension Father   . Hypertension Brother   . Rheum arthritis Maternal Grandmother     History   Social History  . Marital Status: Single    Spouse Name: N/A    Number of Children: N/A  . Years of Education: N/A   Occupational History  . Not on file.   Social History Main Topics  . Smoking status: Never Smoker   . Smokeless tobacco: Never Used  . Alcohol Use: 1.8 oz/week    3 Not specified per  week  . Drug Use: No  . Sexual Activity:    Partners: Male    Birth Control/ Protection: IUD     Comment: Mirena IUD--inserted 08-23-14   Other Topics Concern  . Not on file   Social History Narrative    ROS:  Pertinent items are noted in HPI.  PHYSICAL EXAMINATION:    BP 124/70 mmHg  Pulse 76  Ht 5\' 2"  (1.575 m)  Wt 231 lb 3.2 oz (104.872 kg)  BMI 42.28 kg/m2  LMP 08/21/2014 (Exact Date)     General appearance: alert, cooperative and appears stated age   Pelvic: External genitalia:  no lesions              Urethra:  normal appearing urethra with no masses, tenderness or lesions              Bartholins and Skenes: normal                 Vagina: normal appearing vagina with normal color and discharge, no lesions              Cervix: normal appearance.  IUD strings noted.  Bimanual Exam:  Uterus:  uterus is normal size, shape, consistency and nontender                                      Adnexa: normal adnexa in size, nontender and no masses                                        ASSESSMENT  Mirena IUD patient.  LGSIL.   PLAN  Reassurance about bleeding profile.  IUD working for pregnancy prevention.  Pap and AEX in March 2016.      An After Visit Summary was printed and given to the patient.  _10_____ minutes face to face time of which over 50% was spent in counseling.

## 2014-10-10 NOTE — Patient Instructions (Signed)
Call if you need anything.  Otherwise, I will see you in March for your annual exam and follow up pap smear.

## 2014-11-08 DIAGNOSIS — E7800 Pure hypercholesterolemia, unspecified: Secondary | ICD-10-CM

## 2014-11-08 HISTORY — DX: Pure hypercholesterolemia, unspecified: E78.00

## 2014-12-17 ENCOUNTER — Ambulatory Visit (INDEPENDENT_AMBULATORY_CARE_PROVIDER_SITE_OTHER): Payer: BLUE CROSS/BLUE SHIELD | Admitting: Emergency Medicine

## 2014-12-17 ENCOUNTER — Telehealth: Payer: Self-pay

## 2014-12-17 VITALS — BP 130/90 | HR 91 | Temp 98.4°F | Resp 16 | Ht 62.0 in | Wt 240.0 lb

## 2014-12-17 DIAGNOSIS — F411 Generalized anxiety disorder: Secondary | ICD-10-CM

## 2014-12-17 DIAGNOSIS — G4733 Obstructive sleep apnea (adult) (pediatric): Secondary | ICD-10-CM

## 2014-12-17 MED ORDER — PAROXETINE HCL 20 MG PO TABS: 20.0000 mg | ORAL_TABLET | Freq: Every day | ORAL | Status: DC

## 2014-12-17 MED ORDER — LORAZEPAM 1 MG PO TABS: 1.0000 mg | ORAL_TABLET | Freq: Three times a day (TID) | ORAL | Status: DC | PRN

## 2014-12-17 NOTE — Progress Notes (Signed)
[facility] [address], [place] [address] [phone]  Date:  12/17/2014   Name:  Debra [name]   DOB:  October 29, 1981   MRN:  [id number]  PCP:  No PCP Per Patient    Chief Complaint: Panic Attack   History of Present Illness:  Debra [name] is a 34 y.o. very pleasant female patient who presents with the following:  History of panic disorder about 8 months ago.  Her brother has disappeared somewhere between [place] and [place] and no one knows where he is. He was expressing suicidal thoughts. She is not suicidal and has no thoughts of harm to others. Sleeping "ok".  Does not feel rested in the morning and has sleepiness in the afternoon. Told she "breathes heavy" while sleeping. No improvement with over the counter medications or other home remedies. Denies other complaint or health concern today.    There are no active problems to display for this patient.   Past Medical History  Diagnosis Date  . Hypertension   . Abnormal Pap smear of cervix 01-16-14    -pap nl but Pos.HR HPV/Colpo 02-20-14--LSIL--needs repeat pap 1022yr.    Past Surgical History  Procedure Laterality Date  . Tonsillectomy and adenoidectomy      -age 34  . Toe surgery Bilateral     -as child--in [place]    History  Substance Use Topics  . Smoking status: Never Smoker   . Smokeless tobacco: Never Used  . Alcohol Use: 1.8 oz/week    3 Not specified per week    Family History  Problem Relation Age of Onset  . Thyroid disease Mother   . Osteoarthritis Mother   . Hypertension Father   . Hypertension Brother   . Rheum arthritis Maternal Grandmother     No Known Allergies  Medication list has been reviewed and updated.  Current Outpatient Prescriptions on File Prior to Visit  Medication Sig Dispense Refill  . levonorgestrel (MIRENA) 20 MCG/24HR IUD 1 each by Intrauterine route once.    . Melatonin 3 MG CAPS Take 3 mg by mouth at bedtime.     No current  facility-administered medications on file prior to visit.    Review of Systems:  As per HPI, otherwise negative.    Physical Examination: Filed Vitals:   12/17/14 1356  BP: 130/90  Pulse: 91  Temp: 98.4 F (36.9 C)  Resp: 16   Filed Vitals:   12/17/14 1356  Height: 5\' 2"  (1.575 m)  Weight: 240 lb (108.863 kg)   Body mass index is 43.89 kg/(m^2). Ideal Body Weight: Weight in (lb) to have BMI = 25: 136.4  GEN: obese, NAD, Non-toxic, A & O x 3 HEENT: Atraumatic, Normocephalic. Neck supple. No masses, No LAD. Ears and Nose: No external deformity. CV: RRR, No M/G/R. No JVD. No thrill. No extra heart sounds. PULM: CTA B, no wheezes, crackles, rhonchi. No retractions. No resp. distress. No accessory muscle use. ABD: S, NT, ND, +BS. No rebound. No HSM. EXTR: No c/c/e NEURO Normal gait.  PSYCH: Normally interactive. Conversant. Not depressed or anxious appearing.  Calm demeanor.    Assessment and Plan: Anxiety disorder Ativan  paxil OSA Sleep study  Signed,  Phillips OdorJeffery Marcille Barman, MD

## 2014-12-17 NOTE — Telephone Encounter (Signed)
Spoke with patient at time of incoming call. Phone call passed from [place]. Patient states that she is having a "really bad panic attack right now. I have seen a primary care doctor once in [place] but did not like him. I do not want to go back but what no where to go." Advised patient to take deep breaths. Advised patient will need to be seen at local urgent care or ER. Patient is able to answer questions and communicate well. Patient taking deep breaths. "I think I can make it to the doctor." Encourage patient to have someone else drive her. Patient is agreeable. Patient denies any SI/HI. Patient will seek care at urgent care of local ER today.   Routing to Dr.Miller for review.

## 2014-12-17 NOTE — Telephone Encounter (Signed)
Thank you for instructions provided to pt.  There is a notation from the visit earlier today where pt was treated for this.  OK to close encounter.

## 2014-12-17 NOTE — Patient Instructions (Signed)
Sleep Apnea  Sleep apnea is a sleep disorder characterized by abnormal pauses in breathing while you sleep. When your breathing pauses, the level of oxygen in your blood decreases. This causes you to move out of deep sleep and into light sleep. As a result, your quality of sleep is poor, and the system that carries your blood throughout your body (cardiovascular system) experiences stress. If sleep apnea remains untreated, the following conditions can develop:  High blood pressure (hypertension).  Coronary artery disease.  Inability to achieve or maintain an erection (impotence).  Impairment of your thought process (cognitive dysfunction). There are three types of sleep apnea: 1. Obstructive sleep apnea--Pauses in breathing during sleep because of a blocked airway. 2. Central sleep apnea--Pauses in breathing during sleep because the area of the brain that controls your breathing does not send the correct signals to the muscles that control breathing. 3. Mixed sleep apnea--A combination of both obstructive and central sleep apnea. RISK FACTORS The following risk factors can increase your risk of developing sleep apnea:  Being overweight.  Smoking.  Having narrow passages in your nose and throat.  Being of older age.  Being female.  Alcohol use.  Sedative and tranquilizer use.  Ethnicity. Among individuals younger than 35 years, African Americans are at increased risk of sleep apnea. SYMPTOMS   Difficulty staying asleep.  Daytime sleepiness and fatigue.  Loss of energy.  Irritability.  Loud, heavy snoring.  Morning headaches.  Trouble concentrating.  Forgetfulness.  Decreased interest in sex. DIAGNOSIS  In order to diagnose sleep apnea, your caregiver will perform a physical examination. Your caregiver may suggest that you take a home sleep test. Your caregiver may also recommend that you spend the night in a sleep lab. In the sleep lab, several monitors record  information about your heart, lungs, and brain while you sleep. Your leg and arm movements and blood oxygen level are also recorded. TREATMENT The following actions may help to resolve mild sleep apnea:  Sleeping on your side.   Using a decongestant if you have nasal congestion.   Avoiding the use of depressants, including alcohol, sedatives, and narcotics.   Losing weight and modifying your diet if you are overweight. There also are devices and treatments to help open your airway:  Oral appliances. These are custom-made mouthpieces that shift your lower jaw forward and slightly open your bite. This opens your airway.  Devices that create positive airway pressure. This positive pressure "splints" your airway open to help you breathe better during sleep. The following devices create positive airway pressure:  Continuous positive airway pressure (CPAP) device. The CPAP device creates a continuous level of air pressure with an air pump. The air is delivered to your airway through a mask while you sleep. This continuous pressure keeps your airway open.  Nasal expiratory positive airway pressure (EPAP) device. The EPAP device creates positive air pressure as you exhale. The device consists of single-use valves, which are inserted into each nostril and held in place by adhesive. The valves create very little resistance when you inhale but create much more resistance when you exhale. That increased resistance creates the positive airway pressure. This positive pressure while you exhale keeps your airway open, making it easier to breath when you inhale again.  Bilevel positive airway pressure (BPAP) device. The BPAP device is used mainly in patients with central sleep apnea. This device is similar to the CPAP device because it also uses an air pump to deliver continuous air pressure   through a mask. However, with the BPAP machine, the pressure is set at two different levels. The pressure when you  exhale is lower than the pressure when you inhale.  Surgery. Typically, surgery is only done if you cannot comply with less invasive treatments or if the less invasive treatments do not improve your condition. Surgery involves removing excess tissue in your airway to create a wider passage way. Document Released: 10/15/2002 Document Revised: 02/19/2013 Document Reviewed: 03/02/2012 [facility] Patient Information 2015 [place], [place]. This information is not intended to replace advice given to you by your health care provider. Make sure you discuss any questions you have with your health care provider. Generalized Anxiety Disorder Generalized anxiety disorder (GAD) is a mental disorder. It interferes with life functions, including relationships, work, and school. GAD is different from normal anxiety, which everyone experiences at some point in their lives in response to specific life events and activities. Normal anxiety actually helps us prepare for and get through these life events and activities. Normal anxiety goes away after the event or activity is over.  GAD causes anxiety that is not necessarily related to specific events or activities. It also causes excess anxiety in proportion to specific events or activities. The anxiety associated with GAD is also difficult to control. GAD can vary from mild to severe. People with severe GAD can have intense waves of anxiety with physical symptoms (panic attacks).  SYMPTOMS The anxiety and worry associated with GAD are difficult to control. This anxiety and worry are related to many life events and activities and also occur more days than not for 6 months or longer. People with GAD also have three or more of the following symptoms (one or more in children):  Restlessness.   Fatigue.  Difficulty concentrating.   Irritability.  Muscle tension.  Difficulty sleeping or unsatisfying sleep. DIAGNOSIS GAD is diagnosed through an assessment by your health  care provider. Your health care provider will ask you questions aboutyour mood,physical symptoms, and events in your life. Your health care provider may ask you about your medical history and use of alcohol or drugs, including prescription medicines. Your health care provider may also do a physical exam and blood tests. Certain medical conditions and the use of certain substances can cause symptoms similar to those associated with GAD. Your health care provider may refer you to a mental health specialist for further evaluation. TREATMENT The following therapies are usually used to treat GAD:  4. Medication. Antidepressant medication usually is prescribed for long-term daily control. Antianxiety medicines may be added in severe cases, especially when panic attacks occur.  5. Talk therapy (psychotherapy). Certain types of talk therapy can be helpful in treating GAD by providing support, education, and guidance. A form of talk therapy called cognitive behavioral therapy can teach you healthy ways to think about and react to daily life events and activities. 6. Stress managementtechniques. These include yoga, meditation, and exercise and can be very helpful when they are practiced regularly. A mental health specialist can help determine which treatment is best for you. Some people see improvement with one therapy. However, other people require a combination of therapies. Document Released: 02/19/2013 Document Revised: 03/11/2014 Document Reviewed: 02/19/2013 [facility] Patient Information 2015 [place], [place]LL[place]. This information is not intended to replace advice given to you by your health care provider. Make sure you discuss any questions you have with your health care provider.

## 2015-01-13 ENCOUNTER — Telehealth: Payer: Self-pay | Admitting: Obstetrics and Gynecology

## 2015-01-13 NOTE — Telephone Encounter (Signed)
Left message to call Dorothyann Mourer at [phone]. 

## 2015-01-13 NOTE — Telephone Encounter (Signed)
Returning a call to Kaitlyn. °

## 2015-01-13 NOTE — Telephone Encounter (Signed)
Patient would like to see Dr Edward JollySilva. She thinks she has a bacterial infection.

## 2015-01-13 NOTE — Telephone Encounter (Signed)
Spoke with patient. Patient states "I think I have a bacterial infection." Has been having vaginal discharge, itching, and irritation for one week. Patient requesting appointment with Dr.Silva. Offered appointment on 3/9 at 11:30am with Dr.Silva but patient declines due to having an appointment during that time already. Patient is agreeable to see another provider as Dr.Silva currently does not have any other openings this week. Appointment scheduled for tomorrow at 11:15am with Lauro FranklinPatricia Rolen-Grubb, FNP. Patient is agreeable to date and time.  Routing to provider for final review. Patient agreeable to disposition. Will close encounter

## 2015-01-14 ENCOUNTER — Ambulatory Visit (INDEPENDENT_AMBULATORY_CARE_PROVIDER_SITE_OTHER): Payer: BLUE CROSS/BLUE SHIELD | Admitting: Nurse Practitioner

## 2015-01-14 ENCOUNTER — Encounter: Payer: Self-pay | Admitting: Nurse Practitioner

## 2015-01-14 VITALS — BP 130/84 | HR 60 | Temp 98.6°F | Ht 62.0 in | Wt 243.0 lb

## 2015-01-14 DIAGNOSIS — N760 Acute vaginitis: Secondary | ICD-10-CM

## 2015-01-14 NOTE — Progress Notes (Signed)
Encounter reviewed by Dr. Reynoldo Mainer Silva.  

## 2015-01-14 NOTE — Patient Instructions (Signed)
Bacterial Vaginosis Bacterial vaginosis is a vaginal infection that occurs when the normal balance of bacteria in the vagina is disrupted. It results from an overgrowth of certain bacteria. This is the most common vaginal infection in women of childbearing age. Treatment is important to prevent complications, especially in pregnant women, as it can cause a premature delivery. CAUSES  Bacterial vaginosis is caused by an increase in harmful bacteria that are normally present in smaller amounts in the vagina. Several different kinds of bacteria can cause bacterial vaginosis. However, the reason that the condition develops is not fully understood. RISK FACTORS Certain activities or behaviors can put you at an increased risk of developing bacterial vaginosis, including:  Having a new sex partner or multiple sex partners.  Douching.  Using an intrauterine device (IUD) for contraception. Women do not get bacterial vaginosis from toilet seats, bedding, swimming pools, or contact with objects around them. SIGNS AND SYMPTOMS  Some women with bacterial vaginosis have no signs or symptoms. Common symptoms include:  Grey vaginal discharge.  A fishlike odor with discharge, especially after sexual intercourse.  Itching or burning of the vagina and vulva.  Burning or pain with urination. DIAGNOSIS  Your health care provider will take a medical history and examine the vagina for signs of bacterial vaginosis. A sample of vaginal fluid may be taken. Your health care provider will look at this sample under a microscope to check for bacteria and abnormal cells. A vaginal pH test may also be done.  TREATMENT  Bacterial vaginosis may be treated with antibiotic medicines. These may be given in the form of a pill or a vaginal cream. A second round of antibiotics may be prescribed if the condition comes back after treatment.  HOME CARE INSTRUCTIONS   Only take over-the-counter or prescription medicines as  directed by your health care provider.  If antibiotic medicine was prescribed, take it as directed. Make sure you finish it even if you start to feel better.  Do not have sex until treatment is completed.  Tell all sexual partners that you have a vaginal infection. They should see their health care provider and be treated if they have problems, such as a mild rash or itching.  Practice safe sex by using condoms and only having one sex partner. SEEK MEDICAL CARE IF:   Your symptoms are not improving after 3 days of treatment.  You have increased discharge or pain.  You have a fever. MAKE SURE YOU:   Understand these instructions.  Will watch your condition.  Will get help right away if you are not doing well or get worse. FOR MORE INFORMATION  Centers for Disease Control and Prevention, Division of STD Prevention: www.cdc.gov/std American Sexual Health Association (ASHA): www.ashastd.org  Document Released: 10/25/2005 Document Revised: 08/15/2013 Document Reviewed: 06/06/2013 ExitCare Patient Information 2015 ExitCare, LLC. This information is not intended to replace advice given to you by your health care provider. Make sure you discuss any questions you have with your health care provider.  

## 2015-01-14 NOTE — Progress Notes (Signed)
33 y.o.SW Fe G0 here with complaint of vaginal symptoms of itching, burning, and increase discharge. Describes discharge as white to grey.  Some odor. Onset of symptoms 5-7 days ago. Denies new personal products or vaginal dryness.  Same partner for 9 months.   No STD concerns. No urinary symptoms. Contraception is Mirena IUD inserted on 08/23/14.  Home vaginal Ph balance test showed BV.  No recent antibiotics.   O:Healthy female WDWN Affect: normal, orientation x 3  Exam: alert and in no distress Abdomen: soft and non tender Lymph node: no enlargement or tenderness Pelvic exam: External genital: normal female BUS: negative Vagina:  Yellow discharge noted.  Affirm taken Cervix: normal, non tender, no CMT, IUD strings are visibile Uterus: normal, non tender Adnexa: normal, non tender, no masses or fullness noted   Affirm test is done   A: Normal pelvic exam  R/O vaginitis  Mirena IUD 08/2014   P: Discussed findings of vaginitis and etiology. Discussed Aveeno or baking soda sitz bath for comfort. Avoid moist clothes or pads for extended period of time. If working out in gym clothes or swim suits for long periods of time change underwear or bottoms of swimsuit if possible. Olive Oil/Coconut Oil use for skin protection prior to activity can be used to external skin.  Rx: pending the test results  RV prn

## 2015-01-15 ENCOUNTER — Other Ambulatory Visit: Payer: Self-pay | Admitting: Nurse Practitioner

## 2015-01-15 LAB — WET PREP BY MOLECULAR PROBE
Candida species: NEGATIVE
Gardnerella vaginalis: POSITIVE — AB
Trichomonas vaginosis: NEGATIVE

## 2015-01-15 MED ORDER — METRONIDAZOLE 0.75 % VA GEL: 1.0000 | Freq: Every day | VAGINAL | Status: DC

## 2015-01-17 ENCOUNTER — Ambulatory Visit (INDEPENDENT_AMBULATORY_CARE_PROVIDER_SITE_OTHER): Payer: BLUE CROSS/BLUE SHIELD | Admitting: Obstetrics and Gynecology

## 2015-01-17 ENCOUNTER — Encounter: Payer: Self-pay | Admitting: Obstetrics and Gynecology

## 2015-01-17 ENCOUNTER — Telehealth: Payer: Self-pay | Admitting: Nurse Practitioner

## 2015-01-17 VITALS — BP 110/80 | HR 90 | Temp 98.8°F | Ht 62.0 in | Wt 243.6 lb

## 2015-01-17 DIAGNOSIS — R102 Pelvic and perineal pain: Secondary | ICD-10-CM

## 2015-01-17 LAB — POCT URINALYSIS DIPSTICK
BILIRUBIN UA: NEGATIVE
Blood, UA: NEGATIVE
Glucose, UA: NEGATIVE
Ketones, UA: NEGATIVE
LEUKOCYTES UA: NEGATIVE
Nitrite, UA: NEGATIVE
PROTEIN UA: NEGATIVE
Urobilinogen, UA: NEGATIVE
pH, UA: 5

## 2015-01-17 LAB — POCT URINE PREGNANCY: Preg Test, Ur: NEGATIVE

## 2015-01-17 NOTE — Telephone Encounter (Signed)
Spoke with patient. Advised that I have spoken with Dr.Silva who would like patient to be assessed today. Patient is agreeable. Appointment scheduled for 1:45pm with Dr.Silva. Patient is agreeable to date and time.   Routing to provider for final review. Patient agreeable to disposition. Will close encounter

## 2015-01-17 NOTE — Telephone Encounter (Signed)
Pt started antibiotic for bacterial infection on Wednesday and this morning pt states she woke up with cramps and pelvic pain with small amount of bleeding.  No paper chart

## 2015-01-17 NOTE — Telephone Encounter (Signed)
Spoke with patient. Patient states that she woke up this morning with "intense cramping and sharp pain that comes and goes. I also started to have some light spotting." States that sharp pain is in the middle of the pelvis region and is an 8-9/10. Patient has an IUD which was inserted on 08/23/2014. Patient LMP was 12/29/2014. Denies andy nausea, vomiting, fevers, or chills. Patient is not able to feel her IUD strings. Advised will speak with Dr.Silva regarding evaluation and scheduling and return call. Patient is agreeable. Is available to be seen today.

## 2015-01-17 NOTE — Progress Notes (Signed)
Patient ID: Debra [name], female   DOB: 11-10-1980, 34 y.o.   MRN: [id number] GYNECOLOGY VISIT  PCP:   Referring provider:   HPI: 34 y.o.   Single  Caucasian  female   G0P0 with Patient's last menstrual period was 12/26/2014.   here for mid lower pelvic pain which began today.  Patient states it is sharp and lasts for 30 seconds.  She states she does not have any urinary symptoms.  Developed sharp midline pain while driving the car.  Pain coming and going.  Some bleeding today.  Menses are a little more crampy with the IUD in place. Some headache.  No nausea or vomiting. No diarrhea.  Last BM today.  No pain or urgency to void.  Fatigue.   No sexual activity for one week. No change in partners.   Patient here 3 days ago for a vaginitis exam Started Metrogel 2 nights ago.  Working well, not itching or burning.   Recently dealing with anxiety and panic and is taking Paxil and Ativan.   Urine:  Negative Urine UPT: Negative  GYNECOLOGIC HISTORY: Patient's last menstrual period was 12/26/2014. Sexually active:  yes Partner preference: female Contraception:   Mirena IUD Menopausal hormone therapy: n/a DES exposure:   no Blood transfusions: no   Sexually transmitted diseases: no   GYN procedures and prior surgeries:  no Last mammogram: n/a                Last pap and high risk HPV testing:  01-16-14 wnl:Pos HR HPV' Colpo 4/15 - LGSIL History of abnormal pap smear:  Yes, 01-16-14 wnl: Pos.HR HPV; Colpo 4/15 - LGSIL   OB History    Gravida Para Term Preterm AB TAB SAB Ectopic Multiple Living   0                There are no active problems to display for this patient.   Past Medical History  Diagnosis Date  . Hypertension   . Abnormal Pap smear of cervix 01-16-14    -pap nl but Pos.HR HPV/Colpo 02-20-14--LSIL--needs repeat pap 4224yr.    Past Surgical History  Procedure Laterality Date  . Tonsillectomy and adenoidectomy      -age 34  . Toe surgery Bilateral      [place]    Current Outpatient Prescriptions  Medication Sig Dispense Refill  . levonorgestrel (MIRENA) 20 MCG/24HR IUD 1 each by Intrauterine route once.    Marland Kitchen. LORazepam (ATIVAN) 1 MG tablet Take 1 tablet (1 mg total) by mouth every 8 (eight) hours as needed for anxiety. 90 tablet 0  . metroNIDAZOLE (METROGEL) 0.75 % vaginal gel Place 1 Applicatorful vaginally at bedtime. 70 g 0   No current facility-administered medications for this visit.     ALLERGIES: Review of patient's allergies indicates no known allergies.  Family History  Problem Relation Age of Onset  . Thyroid disease Mother   . Osteoarthritis Mother   . Hypertension Father   . Hypertension Brother   . Rheum arthritis Maternal Grandmother     History   Social History  . Marital Status: Single    Spouse Name: N/A  . Number of Children: N/A  . Years of Education: N/A   Occupational History  . Not on file.   Social History Main Topics  . Smoking status: Never Smoker   . Smokeless tobacco: Never Used  . Alcohol Use: 1.8 oz/week    3 Standard drinks or equivalent per week  .  Drug Use: No  . Sexual Activity:    Partners: Male    Birth Control/ Protection: IUD     Comment: Mirena IUD--inserted 08-23-14   Other Topics Concern  . Not on file   Social History Narrative    ROS:  Pertinent items are noted in HPI.  PHYSICAL EXAMINATION:    BP 110/80 mmHg  Pulse 90  Temp(Src) 98.8 F (37.1 C) (Oral)  Ht  (1.575 m)  Wt 243 lb 9.6 oz (110.496 kg)  BMI 44.54 kg/m2  LMP 12/26/2014   Wt Readings from Last 3 Encounters:  01/17/15 243 lb 9.6 oz (110.496 kg)  01/14/15 243 lb (110.224 kg)  12/17/14 240 lb (108.863 kg)     Ht Readings from Last 3 Encounters:  01/17/15  (1.575 m)  01/14/15  (1.575 m)  12/17/14  (1.575 m)    General appearance: alert, cooperative and appears stated age Abdomen: soft, non-tender; no masses,  no  Back:  No CVA tenderness. Extremities:  extremities normal, atraumatic, no cyanosis or edema. No abnormal inguinal nodes palpated Neurologic: Grossly normal  Pelvic: External genitalia:  no lesions              Urethra:  normal appearing urethra with no masses, tenderness or lesions              Bartholins and Skenes: normal                 Vagina: normal appearing vagina with normal color and discharge, no lesions              Cervix: normal appearance.  IUD strings seen.  Blood mixed with mucous and Metrogel.                  Bimanual Exam:  Uterus:  uterus is normal size, shape, consistency and nontender                                      Adnexa: normal adnexa in size, nontender and no masses                                         ASSESSMENT  Pelvic pain/dysmenorrhea. Mirena IUD patient.  No acute abdomen.  No evidence of PID.  Bacterial vaginosis under treatment.  PLAN  Will check UPT and urine dip - both negative.  Motrin 800 mg po q 8 hours prn.   Observational management.  Follow up for routine exam later this month and sooner as needed.  An After Visit Summary was printed and given to the patient.  15 minutes face to face time of which over 50% was spent in counseling.

## 2015-01-23 ENCOUNTER — Ambulatory Visit: Payer: BC Managed Care – PPO | Admitting: Obstetrics and Gynecology

## 2015-01-29 ENCOUNTER — Encounter: Payer: Self-pay | Admitting: Obstetrics and Gynecology

## 2015-01-29 ENCOUNTER — Ambulatory Visit (INDEPENDENT_AMBULATORY_CARE_PROVIDER_SITE_OTHER): Payer: BLUE CROSS/BLUE SHIELD | Admitting: Obstetrics and Gynecology

## 2015-01-29 VITALS — BP 122/88 | HR 80 | Resp 22 | Ht 62.0 in | Wt 245.2 lb

## 2015-01-29 DIAGNOSIS — Z0000 Encounter for general adult medical examination without abnormal findings: Secondary | ICD-10-CM

## 2015-01-29 DIAGNOSIS — R896 Abnormal cytological findings in specimens from other organs, systems and tissues: Secondary | ICD-10-CM | POA: Diagnosis not present

## 2015-01-29 DIAGNOSIS — Z01419 Encounter for gynecological examination (general) (routine) without abnormal findings: Secondary | ICD-10-CM | POA: Diagnosis not present

## 2015-01-29 DIAGNOSIS — IMO0002 Reserved for concepts with insufficient information to code with codable children: Secondary | ICD-10-CM

## 2015-01-29 NOTE — Patient Instructions (Signed)

## 2015-01-29 NOTE — Progress Notes (Signed)
Patient ID: Debra [name], female   DOB: [birth year], 34 y.o.   MRN: [id number] 34 y.o. G0P0 SingleCaucasianF here for annual exam.    Pelvic pain resolved.   Happy with IUD.   Vaginitis resolved.   Takes Ativan as needed.  Did not continue with Paxil.   Had STD testing in October 2015 and was normal.  No change in partner since then.  Declines STD testing.   PCP:  Leodis SiasFrancis Wong, MD  Patient's last menstrual period was 01/26/2015.          Sexually active: Yes.  female partner  The current method of family planning is IUD.--Mirena  inserted 08-23-14. Exercising: No.  none. Smoker:  no  Health Maintenance: Pap:  01-16-14 wnl:Pos. HR HPV History of abnormal Pap:  Yes, 01-16-14 wnl:Pos. HR HPV, Colposcopy 02-20-14 LGSIL of exocervix and neg. ECC--no treatment. MMG:  n/a Colonoscopy:  n/a BMD:   n/a TDaP:  2015 Screening Labs:   Hb today: PCP, Urine today: Neg   reports that she has never smoked. She has never used smokeless tobacco. She reports that she drinks about 1.8 oz of alcohol per week. She reports that she does not use illicit drugs.  Past Medical History  Diagnosis Date  . Hypertension   . Abnormal Pap smear of cervix 01-16-14    -pap nl but Pos.HR HPV/Colpo 02-20-14--LSIL--needs repeat pap 4139yr.  Marland Kitchen. Hypercholesterolemia 2016  . Anxiety     Past Surgical History  Procedure Laterality Date  . Tonsillectomy and adenoidectomy      -age 34  . Toe surgery Bilateral     -[place] child[place]    Current Outpatient Prescriptions  Medication Sig Dispense Refill  . levonorgestrel (MIRENA) 20 MCG/24HR IUD 1 each by Intrauterine route once.    Marland Kitchen. LORazepam (ATIVAN) 1 MG tablet Take 1 tablet (1 mg total) by mouth every 8 (eight) hours as needed for anxiety. 90 tablet 0   No current facility-administered medications for this visit.    Family History  Problem Relation Age of Onset  . Thyroid disease Mother   . Osteoarthritis Mother   . Hypertension Father   . Hypertension  Brother   . Rheum arthritis Maternal Grandmother     ROS:  Pertinent items are noted in HPI.  Otherwise, a comprehensive ROS was negative.  Exam:   BP 122/88 mmHg  Pulse 80  Resp 22  Ht 5\' 2"  (1.575 m)  Wt 245 lb 3.2 oz (111.222 kg)  BMI 44.84 kg/m2  LMP 01/26/2015     Height: 5\' 2"  (157.5 cm)  Ht Readings from Last 3 Encounters:  01/29/15 5\' 2"  (1.575 m)  01/17/15 5\' 2"  (1.575 m)  01/14/15 5\' 2"  (1.575 m)    General appearance: alert, cooperative and appears stated age Head: Normocephalic, without obvious abnormality, atraumatic Neck: no adenopathy, supple, symmetrical, trachea midline and thyroid normal to inspection and palpation Lungs: clear to auscultation bilaterally Breasts: normal appearance, no masses or tenderness, Inspection negative, No nipple retraction or dimpling, No nipple discharge or bleeding, No axillary or supraclavicular adenopathy Heart: regular rate and rhythm Abdomen: soft, non-tender; bowel sounds normal; no masses,  no organomegaly Extremities: extremities normal, atraumatic, no cyanosis or edema Skin: Skin color, texture, turgor normal. No rashes or lesions Lymph nodes: Cervical, supraclavicular, and axillary nodes normal. No abnormal inguinal nodes palpated Neurologic: Grossly normal   Pelvic: External genitalia:  no lesions              Urethra:  normal  appearing urethra with no masses, tenderness or lesions              Bartholins and Skenes: normal                 Vagina: normal appearing vagina with normal color and discharge, no lesions              Cervix: no lesions.  IUD string palpable.              Pap taken: Yes.   Bimanual Exam:  Uterus:  normal size, contour, position, consistency, mobility, non-tender.  Bimanual exam limited by [facility].               Adnexa: no mass, fullness, tenderness               Rectovaginal: Confirms               Anus:  normal sphincter tone, no lesions  Chaperone was present for exam.  A:  Well Woman with  normal exam. History of LGSIL.  Mirena IUD patient.   P:   Mammogram at age 74.  Do periodic self breast exams.  pap smear and HR HPV performed.  Encouraged exercise.  return annually or prn

## 2015-01-31 LAB — IPS PAP TEST WITH HPV

## 2015-03-12 ENCOUNTER — Ambulatory Visit
Admission: RE | Admit: 2015-03-12 | Discharge: 2015-03-12 | Disposition: A | Discharge status: Home / Self Care | Payer: BLUE CROSS/BLUE SHIELD | Source: Ambulatory Visit | Attending: Family Medicine | Admitting: Family Medicine

## 2015-03-12 ENCOUNTER — Other Ambulatory Visit: Payer: Self-pay | Admitting: Family Medicine

## 2015-03-12 DIAGNOSIS — M545 Low back pain: Secondary | ICD-10-CM

## 2015-03-12 DIAGNOSIS — M79674 Pain in right toe(s): Secondary | ICD-10-CM

## 2015-03-12 DIAGNOSIS — L539 Erythematous condition, unspecified: Secondary | ICD-10-CM

## 2015-07-09 ENCOUNTER — Ambulatory Visit: Payer: BLUE CROSS/BLUE SHIELD | Admitting: Certified Nurse Midwife

## 2015-07-10 ENCOUNTER — Ambulatory Visit (INDEPENDENT_AMBULATORY_CARE_PROVIDER_SITE_OTHER): Payer: BLUE CROSS/BLUE SHIELD | Admitting: Certified Nurse Midwife

## 2015-07-10 ENCOUNTER — Encounter: Payer: Self-pay | Admitting: Certified Nurse Midwife

## 2015-07-10 VITALS — BP 116/80 | HR 72 | Resp 16 | Ht 62.0 in | Wt 250.0 lb

## 2015-07-10 DIAGNOSIS — N760 Acute vaginitis: Secondary | ICD-10-CM

## 2015-07-10 DIAGNOSIS — B3731 Acute candidiasis of vulva and vagina: Secondary | ICD-10-CM

## 2015-07-10 DIAGNOSIS — A499 Bacterial infection, unspecified: Secondary | ICD-10-CM | POA: Diagnosis not present

## 2015-07-10 DIAGNOSIS — B9689 Other specified bacterial agents as the cause of diseases classified elsewhere: Secondary | ICD-10-CM

## 2015-07-10 DIAGNOSIS — B373 Candidiasis of vulva and vagina: Secondary | ICD-10-CM

## 2015-07-10 MED ORDER — HYLAFEM VA SUPP: 1.0000 | Freq: Every day | VAGINAL | Status: DC

## 2015-07-10 NOTE — Progress Notes (Signed)
34 y.o.Single g0p0 german white here with complaint of vaginal symptoms of itching, burning, and increase discharge. Describes discharge as watery with odor..Onset of symptoms 2-3weeks ago. Denies new personal products .recent return from [place] after 2 weeks and noted this had continued while there.No STD concerns. Urinary symptoms none . Contraception is Mirena IUD since 10/15. Patient is working with a Systems analyst for exercise and weight loss. No other health concerns today.  O:Healthy female WDWN Affect: normal, orientation x 3  Exam: Abdomen: Soft, non tender Lymph node: no enlargement or tenderness Pelvic exam: External genital: normal female BUS: negative Vagina: watery grey odorous discharge noted. Ph:5.0   ,Wet prep taken, Cervix: normal, non tender, no CMT Uterus: normal, non tender difficult to palpate due to body habitus Adnexa:adnexal not palpated due to body habitus, no masses or fullness noted or tenderness  noted   Wet Prep results: positive for clue and yeast   A:Normal pelvic exam limited due to body habitus BV Yeast vaginitis   P:Discussed findings of BV and yeast vaginitis and etiology. Discussed Aveeno or baking soda sitz bath for comfort. Avoid moist clothes for extended period of time. If working out in gym clothes or swim suits for long periods of time change underwear or bottoms of swimsuit if possible. Rx: Hylafem with instructions given   Rv prn

## 2015-07-10 NOTE — Patient Instructions (Signed)
Monilial Vaginitis Vaginitis in a soreness, swelling and redness (inflammation) of the vagina and vulva. Monilial vaginitis is not a sexually transmitted infection. CAUSES  Yeast vaginitis is caused by yeast (candida) that is normally found in your vagina. With a yeast infection, the candida has overgrown in number to a point that upsets the chemical balance. SYMPTOMS   White, thick vaginal discharge.  Swelling, itching, redness and irritation of the vagina and possibly the lips of the vagina (vulva).  Burning or painful urination.  Painful intercourse. DIAGNOSIS  Things that may contribute to monilial vaginitis are:  Postmenopausal and virginal states.  Pregnancy.  Infections.  Being tired, sick or stressed, especially if you had monilial vaginitis in the past.  Diabetes. Good control will help lower the chance.  Birth control pills.  Tight fitting garments.  Using bubble bath, feminine sprays, douches or deodorant tampons.  Taking certain medications that kill germs (antibiotics).  Sporadic recurrence can occur if you become ill. TREATMENT  Your caregiver will give you medication.  There are several kinds of anti monilial vaginal creams and suppositories specific for monilial vaginitis. For recurrent yeast infections, use a suppository or cream in the vagina 2 times a week, or as directed.  Anti-monilial or steroid cream for the itching or irritation of the vulva may also be used. Get your caregiver's permission.  Painting the vagina with methylene blue solution may help if the monilial cream does not work.  Eating yogurt may help prevent monilial vaginitis. HOME CARE INSTRUCTIONS   Finish all medication as prescribed.  Do not have sex until treatment is completed or after your caregiver tells you it is okay.  Take warm sitz baths.  Do not douche.  Do not use tampons, especially scented ones.  Wear cotton underwear.  Avoid tight pants and panty  hose.  Tell your sexual partner that you have a yeast infection. They should go to their caregiver if they have symptoms such as mild rash or itching.  Your sexual partner should be treated as well if your infection is difficult to eliminate.  Practice safer sex. Use condoms.  Some vaginal medications cause latex condoms to fail. Vaginal medications that harm condoms are:  Cleocin cream.  Butoconazole (Femstat).  Terconazole (Terazol) vaginal suppository.  Miconazole (Monistat) (may be purchased over the counter). SEEK MEDICAL CARE IF:   You have a temperature by mouth above 102 F (38.9 C).  The infection is getting worse after 2 days of treatment.  The infection is not getting better after 3 days of treatment.  You develop blisters in or around your vagina.  You develop vaginal bleeding, and it is not your menstrual period.  You have pain when you urinate.  You develop intestinal problems.  You have pain with sexual intercourse. Document Released: 08/04/2005 Document Revised: 01/17/2012 Document Reviewed: 04/18/2009 ExitCare Patient Information 2015 [employer]. This information is not intended to replace advice given to you by your health care provider. Make sure you discuss any questions you have with your health care provider. Bacterial Vaginosis Bacterial vaginosis is a vaginal infection that occurs when the normal balance of bacteria in the vagina is disrupted. It results from an overgrowth of certain bacteria. This is the most common vaginal infection in women of childbearing age. Treatment is important to prevent complications, especially in pregnant women, as it can cause a premature delivery. CAUSES  Bacterial vaginosis is caused by an increase in harmful bacteria that are normally present in smaller amounts in the   vagina. Several different kinds of bacteria can cause bacterial vaginosis. However, the reason that the condition develops is not fully  understood. RISK FACTORS Certain activities or behaviors can put you at an increased risk of developing bacterial vaginosis, including:  Having a new sex partner or multiple sex partners.  Douching.  Using an intrauterine device (IUD) for contraception. Women do not get bacterial vaginosis from toilet seats, bedding, swimming pools, or contact with objects around them. SIGNS AND SYMPTOMS  Some women with bacterial vaginosis have no signs or symptoms. Common symptoms include:  Grey vaginal discharge.  A fishlike odor with discharge, especially after sexual intercourse.  Itching or burning of the vagina and vulva.  Burning or pain with urination. DIAGNOSIS  Your health care provider will take a medical history and examine the vagina for signs of bacterial vaginosis. A sample of vaginal fluid may be taken. Your health care provider will look at this sample under a microscope to check for bacteria and abnormal cells. A vaginal pH test may also be done.  TREATMENT  Bacterial vaginosis may be treated with antibiotic medicines. These may be given in the form of a pill or a vaginal cream. A second round of antibiotics may be prescribed if the condition comes back after treatment.  HOME CARE INSTRUCTIONS   Only take over-the-counter or prescription medicines as directed by your health care provider.  If antibiotic medicine was prescribed, take it as directed. Make sure you finish it even if you start to feel better.  Do not have sex until treatment is completed.  Tell all sexual partners that you have a vaginal infection. They should see their health care provider and be treated if they have problems, such as a mild rash or itching.  Practice safe sex by using condoms and only having one sex partner. SEEK MEDICAL CARE IF:   Your symptoms are not improving after 3 days of treatment.  You have increased discharge or pain.  You have a fever. MAKE SURE YOU:   Understand these  instructions.  Will watch your condition.  Will get help right away if you are not doing well or get worse. FOR MORE INFORMATION  Centers for Disease Control and Prevention, Division of STD Prevention: www.cdc.gov/std American Sexual Health Association (ASHA): www.ashastd.org  Document Released: 10/25/2005 Document Revised: 08/15/2013 Document Reviewed: 06/06/2013 ExitCare Patient Information 2015 [employer]. This information is not intended to replace advice given to you by your health care provider. Make sure you discuss any questions you have with your health care provider.  

## 2015-07-10 NOTE — Progress Notes (Signed)
Reviewed personally.  M. Suzanne Mallorey Odonell, MD.  

## 2015-07-25 ENCOUNTER — Telehealth: Payer: Self-pay | Admitting: Certified Nurse Midwife

## 2015-07-25 NOTE — Telephone Encounter (Signed)
Pt was treated for BV early 9/16 & was given rx for hylafem #6 with 1 refill. Pt used rx for 3 nights & waiting a week & used it for 3 more nights. Pt states she still has odor & vaginal area is sensitive. Pt still has 1 refill left at pharmacy. Please advise.

## 2015-07-25 NOTE — Telephone Encounter (Signed)
Patient says she thinks she may need another prescription sent to pharmacy for infection.

## 2015-07-25 NOTE — Telephone Encounter (Signed)
Have patient to try Aveeno sitz bath for 3 days, if not resolved do one more round of Hylafem. If  No change needs OV

## 2015-07-25 NOTE — Telephone Encounter (Signed)
Pt aware & will let us know if no change.

## 2015-12-31 ENCOUNTER — Encounter: Payer: Self-pay | Admitting: Obstetrics and Gynecology

## 2015-12-31 ENCOUNTER — Ambulatory Visit (INDEPENDENT_AMBULATORY_CARE_PROVIDER_SITE_OTHER): Payer: 59 | Admitting: Obstetrics and Gynecology

## 2015-12-31 VITALS — BP 128/74 | HR 90 | Ht 62.0 in | Wt 260.0 lb

## 2015-12-31 DIAGNOSIS — E669 Obesity, unspecified: Secondary | ICD-10-CM | POA: Diagnosis not present

## 2015-12-31 DIAGNOSIS — N760 Acute vaginitis: Secondary | ICD-10-CM | POA: Diagnosis not present

## 2015-12-31 DIAGNOSIS — Z113 Encounter for screening for infections with a predominantly sexual mode of transmission: Secondary | ICD-10-CM

## 2015-12-31 NOTE — Patient Instructions (Addendum)
Polycystic Ovarian Syndrome Polycystic ovarian syndrome (PCOS) is a common hormonal disorder among women of reproductive age. Most women with PCOS grow many small cysts on their ovaries. PCOS can cause problems with your periods and make it difficult to get pregnant. It can also cause an increased risk of miscarriage with pregnancy. If left untreated, PCOS can lead to serious health problems, such as diabetes and heart disease. CAUSES The cause of PCOS is not fully understood, but genetics may be a factor. SIGNS AND SYMPTOMS   Infrequent or no menstrual periods.   Inability to get pregnant (infertility) because of not ovulating.   Increased growth of hair on the face, chest, stomach, back, thumbs, thighs, or toes.   Acne, oily skin, or dandruff.   Pelvic pain.   Weight gain or obesity, usually carrying extra weight around the waist.   Type 2 diabetes.   High cholesterol.   High blood pressure.   Female-pattern baldness or thinning hair.   Patches of thickened and dark brown or black skin on the neck, arms, breasts, or thighs.   Tiny excess flaps of skin (skin tags) in the armpits or neck area.   Excessive snoring and having breathing stop at times while asleep (sleep apnea).   Deepening of the voice.   Gestational diabetes when pregnant.  DIAGNOSIS  There is no single test to diagnose PCOS.   Your health care provider will:   Take a medical history.   Perform a pelvic exam.   Have ultrasonography done.   Check your female and female hormone levels.   Measure glucose or sugar levels in the blood.   Do other blood tests.   If you are producing too many female hormones, your health care provider will make sure it is from PCOS. At the physical exam, your health care provider will want to evaluate the areas of increased hair growth. Try to allow natural hair growth for a few days before the visit.   During a pelvic exam, the ovaries may be enlarged  or swollen because of the increased number of small cysts. This can be seen more easily by using vaginal ultrasonography or screening to examine the ovaries and lining of the uterus (endometrium) for cysts. The uterine lining may become thicker if you have not been having a regular period.  TREATMENT  Because there is no cure for PCOS, it needs to be managed to prevent problems. Treatments are based on your symptoms. Treatment is also based on whether you want to have a baby or whether you need contraception.  Treatment may include:   Progesterone hormone to start a menstrual period.   Birth control pills to make you have regular menstrual periods.   Medicines to make you ovulate, if you want to get pregnant.   Medicines to control your insulin.   Medicine to control your blood pressure.   Medicine and diet to control your high cholesterol and triglycerides in your blood.  Medicine to reduce excessive hair growth.  Surgery, making small holes in the ovary, to decrease the amount of female hormone production. This is done through a long, lighted tube (laparoscope) placed into the pelvis through a tiny incision in the lower abdomen.  HOME CARE INSTRUCTIONS  Only take over-the-counter or prescription medicine as directed by your health care provider.  Pay attention to the foods you eat and your activity levels. This can help reduce the effects of PCOS.  Keep your weight under control.  Eat foods that are   low in carbohydrate and high in fiber.  Exercise regularly. SEEK MEDICAL CARE IF:  Your symptoms do not get better with medicine.  You have new symptoms.   This information is not intended to replace advice given to you by your health care provider. Make sure you discuss any questions you have with your health care provider.   Document Released: 02/18/2005 Document Revised: 08/15/2013 Document Reviewed: 04/12/2013 Elsevier Interactive Patient Education 2016 Elsevier  Inc.  Bacterial Vaginosis Bacterial vaginosis is a vaginal infection that occurs when the normal balance of bacteria in the vagina is disrupted. It results from an overgrowth of certain bacteria. This is the most common vaginal infection in women of childbearing age. Treatment is important to prevent complications, especially in pregnant women, as it can cause a premature delivery. CAUSES  Bacterial vaginosis is caused by an increase in harmful bacteria that are normally present in smaller amounts in the vagina. Several different kinds of bacteria can cause bacterial vaginosis. However, the reason that the condition develops is not fully understood. RISK FACTORS Certain activities or behaviors can put you at an increased risk of developing bacterial vaginosis, including:  Having a new sex partner or multiple sex partners.  Douching.  Using an intrauterine device (IUD) for contraception. Women do not get bacterial vaginosis from toilet seats, bedding, swimming pools, or contact with objects around them. SIGNS AND SYMPTOMS  Some women with bacterial vaginosis have no signs or symptoms. Common symptoms include:  Grey vaginal discharge.  A fishlike odor with discharge, especially after sexual intercourse.  Itching or burning of the vagina and vulva.  Burning or pain with urination. DIAGNOSIS  Your health care provider will take a medical history and examine the vagina for signs of bacterial vaginosis. A sample of vaginal fluid may be taken. Your health care provider will look at this sample under a microscope to check for bacteria and abnormal cells. A vaginal pH test may also be done.  TREATMENT  Bacterial vaginosis may be treated with antibiotic medicines. These may be given in the form of a pill or a vaginal cream. A second round of antibiotics may be prescribed if the condition comes back after treatment. Because bacterial vaginosis increases your risk for sexually transmitted diseases,  getting treated can help reduce your risk for chlamydia, gonorrhea, HIV, and herpes. HOME CARE INSTRUCTIONS   Only take over-the-counter or prescription medicines as directed by your health care provider.  If antibiotic medicine was prescribed, take it as directed. Make sure you finish it even if you start to feel better.  Tell all sexual partners that you have a vaginal infection. They should see their health care provider and be treated if they have problems, such as a mild rash or itching.  During treatment, it is important that you follow these instructions:  Avoid sexual activity or use condoms correctly.  Do not douche.  Avoid alcohol as directed by your health care provider.  Avoid breastfeeding as directed by your health care provider. SEEK MEDICAL CARE IF:   Your symptoms are not improving after 3 days of treatment.  You have increased discharge or pain.  You have a fever. MAKE SURE YOU:   Understand these instructions.  Will watch your condition.  Will get help right away if you are not doing well or get worse. FOR MORE INFORMATION  Centers for Disease Control and Prevention, Division of STD Prevention: [employer]s.co.za American Sexual Health Association (ASHA): www.ashastd.org    This information is not intended  to replace advice given to you by your health care provider. Make sure you discuss any questions you have with your health care provider.   Document Released: 10/25/2005 Document Revised: 11/15/2014 Document Reviewed: 06/06/2013 Elsevier Interactive Patient Education [employer].

## 2015-12-31 NOTE — Progress Notes (Signed)
Patient ID: Debra [name], female   DOB: 25-Jun-1981, 35 y.o.   MRN: [id number] GYNECOLOGY  VISIT   HPI: 35 y.o.   Single  Caucasian  female   G0P0 with No LMP recorded. Patient is not currently having periods (Reason: IUD).   here for vaginal discharge with odor and feels she has another bacterial infection.    This is the third bacterial infection in the last year.   Worried about her IUD.  Also concerned about STD.   Interested in not taking abx regularly.  Patient's father is worried she has PCOS.  On chart review, patient first came into care reporting she was told in [place] that she may have PCOS.  GYNECOLOGIC HISTORY: No LMP recorded. Patient is not currently having periods (Reason: IUD). Contraception:Mirena IUD inserted 08-23-14 Menopausal hormone therapy: None Last mammogram: n/a Last pap smear: 01-29-15 Neg:Neg HR HPV        OB History    Gravida Para Term Preterm AB TAB SAB Ectopic Multiple Living   0                  There are no active problems to display for this patient.   Past Medical History  Diagnosis Date  . Hypertension   . Abnormal Pap smear of cervix 01-16-14    -pap nl but Pos.HR HPV/Colpo 02-20-14--LSIL--needs repeat pap 35yr.  Marland Kitchen Hypercholesterolemia 2016  . Anxiety     Past Surgical History  Procedure Laterality Date  . Tonsillectomy and adenoidectomy      -age 35  . Toe surgery Bilateral     -as child--in [place]    Current Outpatient Prescriptions  Medication Sig Dispense Refill  . levonorgestrel (MIRENA) 20 MCG/24HR IUD 1 each by Intrauterine route once.     No current facility-administered medications for this visit.     ALLERGIES: Review of patient's allergies indicates no known allergies.  Family History  Problem Relation Age of Onset  . Thyroid disease Mother   . Osteoarthritis Mother   . Hypertension Father   . Hypertension Brother   . Rheum arthritis Maternal Grandmother     Social History   Social History  .  Marital Status: Single    Spouse Name: N/A  . Number of Children: N/A  . Years of Education: N/A   Occupational History  . Not on file.   Social History Main Topics  . Smoking status: Former Games developer  . Smokeless tobacco: Never Used  . Alcohol Use: 1.8 oz/week    3 Standard drinks or equivalent per week  . Drug Use: No  . Sexual Activity:    Partners: Male    Birth Control/ Protection: IUD     Comment: Mirena IUD--inserted 08-23-14   Other Topics Concern  . Not on file   Social History Narrative    ROS:  Pertinent items are noted in HPI.  PHYSICAL EXAMINATION:    BP 128/74 mmHg  Pulse 90  Ht  (1.575 m)  Wt 260 lb (117.935 kg)  BMI 47.54 kg/m2    General appearance: alert, cooperative and appears stated age    Pelvic: External genitalia:  no lesions              Urethra:  normal appearing urethra with no masses, tenderness or lesions              Bartholins and Skenes: normal                 Vagina:  normal appearing vagina with normal color and discharge, no lesions              Cervix: no lesions and IUD strings seen.               Bimanual Exam:  Uterus:  normal size, contour, position, consistency, mobility, non-tender              Adnexa: normal adnexa and no mass, fullness, tenderness                 Chaperone was present for exam.  ASSESSMENT  Vaginitis.  Mirena IUD.  Possible PCOS. Obesity.  PLAN  Comprehensive discussion of vaginitis. Affirm and GC/CT done.  Treatment plant to follow.  Comprehensive discussion of PCOS and short term implications, fertility implications, and long term health risks related to this - hyperplasia, DM, HTN, hyperlipidemia causing increased risk of CVD. Weight loss discussed at length.  Patient may pursue Weight Watchers and ask for her partner's support in weight loss.  Follow up for annual exam and prn.  Patient very appreciative of discussion and information provided to her at each visit here.   An After Visit  Summary was printed and given to the patient.  ____25__ minutes face to face time of which over 50% was spent in counseling.

## 2016-01-01 ENCOUNTER — Other Ambulatory Visit: Payer: Self-pay | Admitting: Obstetrics and Gynecology

## 2016-01-01 LAB — WET PREP BY MOLECULAR PROBE
Candida species: NEGATIVE
Gardnerella vaginalis: POSITIVE — AB
Trichomonas vaginosis: NEGATIVE

## 2016-01-01 LAB — GC/CHLAMYDIA PROBE AMP
CT PROBE, AMP APTIMA: NOT DETECTED
GC PROBE AMP APTIMA: NOT DETECTED

## 2016-01-01 MED ORDER — METRONIDAZOLE 500 MG PO TABS: 500.0000 mg | ORAL_TABLET | Freq: Two times a day (BID) | ORAL | Status: DC

## 2016-02-27 ENCOUNTER — Ambulatory Visit: Payer: BLUE CROSS/BLUE SHIELD | Admitting: Obstetrics and Gynecology

## 2016-03-26 ENCOUNTER — Ambulatory Visit (INDEPENDENT_AMBULATORY_CARE_PROVIDER_SITE_OTHER): Payer: 59 | Admitting: Obstetrics and Gynecology

## 2016-03-26 ENCOUNTER — Encounter: Payer: Self-pay | Admitting: Obstetrics and Gynecology

## 2016-03-26 VITALS — BP 110/70 | HR 90 | Resp 22 | Ht 62.0 in | Wt 266.4 lb

## 2016-03-26 DIAGNOSIS — Z01419 Encounter for gynecological examination (general) (routine) without abnormal findings: Secondary | ICD-10-CM | POA: Diagnosis not present

## 2016-03-26 DIAGNOSIS — Z0000 Encounter for general adult medical examination without abnormal findings: Secondary | ICD-10-CM | POA: Diagnosis not present

## 2016-03-26 DIAGNOSIS — M25559 Pain in unspecified hip: Secondary | ICD-10-CM

## 2016-03-26 LAB — POCT URINALYSIS DIPSTICK
Bilirubin, UA: NEGATIVE
Blood, UA: NEGATIVE
Glucose, UA: NEGATIVE
KETONES UA: NEGATIVE
Leukocytes, UA: NEGATIVE
Nitrite, UA: NEGATIVE
PH UA: 5
PROTEIN UA: NEGATIVE
Urobilinogen, UA: NEGATIVE

## 2016-03-26 NOTE — Patient Instructions (Signed)
Health Maintenance, Female Adopting a healthy lifestyle and getting preventive care can go a long way to promote health and wellness. Talk with your health care provider about what schedule of regular examinations is right for you. This is a good chance for you to check in with your provider about disease prevention and staying healthy. In between checkups, there are plenty of things you can do on your own. Experts have done a lot of research about which lifestyle changes and preventive measures are most likely to keep you healthy. Ask your health care provider for more information. WEIGHT AND DIET  Eat a healthy diet  Be sure to include plenty of vegetables, fruits, low-fat dairy products, and lean protein.  Do not eat a lot of foods high in solid fats, added sugars, or salt.  Get regular exercise. This is one of the most important things you can do for your health.  Most adults should exercise for at least 150 minutes each week. The exercise should increase your heart rate and make you sweat (moderate-intensity exercise).  Most adults should also do strengthening exercises at least twice a week. This is in addition to the moderate-intensity exercise.  Maintain a healthy weight  Body mass index (BMI) is a measurement that can be used to identify possible weight problems. It estimates body fat based on height and weight. Your health care provider can help determine your BMI and help you achieve or maintain a healthy weight.  For females 20 years of age and older:   A BMI below 18.5 is considered underweight.  A BMI of 18.5 to 24.9 is normal.  A BMI of 25 to 29.9 is considered overweight.  A BMI of 30 and above is considered obese.  Watch levels of cholesterol and blood lipids  You should start having your blood tested for lipids and cholesterol at 35 years of age, then have this test every 5 years.  You may need to have your cholesterol levels checked more often if:  Your lipid  or cholesterol levels are high.  You are older than 35 years of age.  You are at high risk for heart disease.  CANCER SCREENING   Lung Cancer  Lung cancer screening is recommended for adults 55-80 years old who are at high risk for lung cancer because of a history of smoking.  A yearly low-dose CT scan of the lungs is recommended for people who:  Currently smoke.  Have quit within the past 15 years.  Have at least a 30-pack-year history of smoking. A pack year is smoking an average of one pack of cigarettes a day for 1 year.  Yearly screening should continue until it has been 15 years since you quit.  Yearly screening should stop if you develop a health problem that would prevent you from having lung cancer treatment.  Breast Cancer  Practice breast self-awareness. This means understanding how your breasts normally appear and feel.  It also means doing regular breast self-exams. Let your health care provider know about any changes, no matter how small.  If you are in your 20s or 30s, you should have a clinical breast exam (CBE) by a health care provider every 1-3 years as part of a regular health exam.  If you are 40 or older, have a CBE every year. Also consider having a breast X-ray (mammogram) every year.  If you have a family history of breast cancer, talk to your health care provider about genetic screening.  If you   are at high risk for breast cancer, talk to your health care provider about having an MRI and a mammogram every year.  Breast cancer gene (BRCA) assessment is recommended for women who have family members with BRCA-related cancers. BRCA-related cancers include:  Breast.  Ovarian.  Tubal.  Peritoneal cancers.  Results of the assessment will determine the need for genetic counseling and BRCA1 and BRCA2 testing. Cervical Cancer Your health care provider may recommend that you be screened regularly for cancer of the pelvic organs (ovaries, uterus, and  vagina). This screening involves a pelvic examination, including checking for microscopic changes to the surface of your cervix (Pap test). You may be encouraged to have this screening done every 3 years, beginning at age 21.  For women ages 30-65, health care providers may recommend pelvic exams and Pap testing every 3 years, or they may recommend the Pap and pelvic exam, combined with testing for human papilloma virus (HPV), every 5 years. Some types of HPV increase your risk of cervical cancer. Testing for HPV may also be done on women of any age with unclear Pap test results.  Other health care providers may not recommend any screening for nonpregnant women who are considered low risk for pelvic cancer and who do not have symptoms. Ask your health care provider if a screening pelvic exam is right for you.  If you have had past treatment for cervical cancer or a condition that could lead to cancer, you need Pap tests and screening for cancer for at least 20 years after your treatment. If Pap tests have been discontinued, your risk factors (such as having a new sexual partner) need to be reassessed to determine if screening should resume. Some women have medical problems that increase the chance of getting cervical cancer. In these cases, your health care provider may recommend more frequent screening and Pap tests. Colorectal Cancer  This type of cancer can be detected and often prevented.  Routine colorectal cancer screening usually begins at 35 years of age and continues through 35 years of age.  Your health care provider may recommend screening at an earlier age if you have risk factors for colon cancer.  Your health care provider may also recommend using home test kits to check for hidden blood in the stool.  A small camera at the end of a tube can be used to examine your colon directly (sigmoidoscopy or colonoscopy). This is done to check for the earliest forms of colorectal  cancer.  Routine screening usually begins at age 50.  Direct examination of the colon should be repeated every 5-10 years through 35 years of age. However, you may need to be screened more often if early forms of precancerous polyps or small growths are found. Skin Cancer  Check your skin from head to toe regularly.  Tell your health care provider about any new moles or changes in moles, especially if there is a change in a mole's shape or color.  Also tell your health care provider if you have a mole that is larger than the size of a pencil eraser.  Always use sunscreen. Apply sunscreen liberally and repeatedly throughout the day.  Protect yourself by wearing long sleeves, pants, a wide-brimmed hat, and sunglasses whenever you are outside. HEART DISEASE, DIABETES, AND HIGH BLOOD PRESSURE   High blood pressure causes heart disease and increases the risk of stroke. High blood pressure is more likely to develop in:  People who have blood pressure in the high end   of the normal range (130-139/85-89 mm Hg).  People who are overweight or obese.  People who are African American.  If you are 38-23 years of age, have your blood pressure checked every 3-5 years. If you are 61 years of age or older, have your blood pressure checked every year. You should have your blood pressure measured twice--once when you are at a hospital or clinic, and once when you are not at a hospital or clinic. Record the average of the two measurements. To check your blood pressure when you are not at a hospital or clinic, you can use:  An automated blood pressure machine at a pharmacy.  A home blood pressure monitor.  If you are between 45 years and 39 years old, ask your health care provider if you should take aspirin to prevent strokes.  Have regular diabetes screenings. This involves taking a blood sample to check your fasting blood sugar level.  If you are at a normal weight and have a low risk for diabetes,  have this test once every three years after 35 years of age.  If you are overweight and have a high risk for diabetes, consider being tested at a younger age or more often. PREVENTING INFECTION  Hepatitis B  If you have a higher risk for hepatitis B, you should be screened for this virus. You are considered at high risk for hepatitis B if:  You were born in a country where hepatitis B is common. Ask your health care provider which countries are considered high risk.  Your parents were born in a high-risk country, and you have not been immunized against hepatitis B (hepatitis B vaccine).  You have HIV or AIDS.  You use needles to inject street drugs.  You live with someone who has hepatitis B.  You have had sex with someone who has hepatitis B.  You get hemodialysis treatment.  You take certain medicines for conditions, including cancer, organ transplantation, and autoimmune conditions. Hepatitis C  Blood testing is recommended for:  Everyone born from [birth year] through 1965.  Anyone with known risk factors for hepatitis C. Sexually transmitted infections (STIs)  You should be screened for sexually transmitted infections (STIs) including gonorrhea and chlamydia if:  You are sexually active and are younger than 35 years of age.  You are older than 35 years of age and your health care provider tells you that you are at risk for this type of infection.  Your sexual activity has changed since you were last screened and you are at an increased risk for chlamydia or gonorrhea. Ask your health care provider if you are at risk.  If you do not have HIV, but are at risk, it may be recommended that you take a prescription medicine daily to prevent HIV infection. This is called pre-exposure prophylaxis (PrEP). You are considered at risk if:  You are sexually active and do not regularly use condoms or know the HIV status of your partner(s).  You take drugs by injection.  You are sexually  active with a partner who has HIV. Talk with your health care provider about whether you are at high risk of being infected with HIV. If you choose to begin PrEP, you should first be tested for HIV. You should then be tested every 3 months for as long as you are taking PrEP.  PREGNANCY   If you are premenopausal and you may become pregnant, ask your health care provider about preconception counseling.  If you may  become pregnant, take 400 to 800 micrograms (mcg) of folic acid every day.  If you want to prevent pregnancy, talk to your health care provider about birth control (contraception). OSTEOPOROSIS AND MENOPAUSE   Osteoporosis is a disease in which the bones lose minerals and strength with aging. This can result in serious bone fractures. Your risk for osteoporosis can be identified using a bone density scan.  If you are 61 years of age or older, or if you are at risk for osteoporosis and fractures, ask your health care provider if you should be screened.  Ask your health care provider whether you should take a calcium or vitamin D supplement to lower your risk for osteoporosis.  Menopause may have certain physical symptoms and risks.  Hormone replacement therapy may reduce some of these symptoms and risks. Talk to your health care provider about whether hormone replacement therapy is right for you.  HOME CARE INSTRUCTIONS   Schedule regular health, dental, and eye exams.  Stay current with your immunizations.   Do not use any tobacco products including cigarettes, chewing tobacco, or electronic cigarettes.  If you are pregnant, do not drink alcohol.  If you are breastfeeding, limit how much and how often you drink alcohol.  Limit alcohol intake to no more than 1 drink per day for nonpregnant women. One drink equals 12 ounces of beer, 5 ounces of wine, or 1 ounces of hard liquor.  Do not use street drugs.  Do not share needles.  Ask your health care provider for help if  you need support or information about quitting drugs.  Tell your health care provider if you often feel depressed.  Tell your health care provider if you have ever been abused or do not feel safe at home.   This information is not intended to replace advice given to you by your health care provider. Make sure you discuss any questions you have with your health care provider.   Document Released: 05/10/2011 Document Revised: 11/15/2014 Document Reviewed: 09/26/2013 Elsevier Interactive Patient Education [employer].

## 2016-03-26 NOTE — Progress Notes (Signed)
Patient ID: Debra [name], female   DOB: 1981/01/08, 35 y.o.   MRN: [id number] 35 y.o. G0P0 Single Caucasian female here for annual exam.    Has Mirena IUD.  Having a lot of irregular spotting.  Spots for 4 - 6 total days per month.  Cramping stabbing type pain.  Cramping more in general.   Has Ativan if she needs tx for anxiety.  More anxiety when increased work load.  Sees therapist.   Getting [place] in [place] next year.   Declines STD testing.   [place]  PCP:  Leodis SiasFrancis Wong, MD   No LMP recorded. Patient is not currently having periods (Reason: IUD).    Patient complains of irregular spotting with Mirena IUD.   Period Cycle (Days):  (irregular spotting with Mirena IUD) Sexually active: Yes.  female  The current method of family planning is IUD--Mirena inserted 08-23-14.    Exercising: Yes.    circuit training 2x/week. Smoker:  no  Health Maintenance: Pap:  01-29-15 Neg:Neg HR HPV History of abnormal Pap:  Yes, 01-16-14 wnl:Pos. HR HPV, Colposcopy 02-20-14 LGSIL of exocervix and neg. ECC--no treatment. MMG:  n/a Colonoscopy:  n/a BMD:   n/a  Result  n/a TDaP:  2014 Gardasil:   no Screening Labs:  Hb today: PCP, Urine today: Neg   reports that she has quit smoking. She has never used smokeless tobacco. She reports that she drinks about 1.2 oz of alcohol per week. She reports that she does not use illicit drugs.  Past Medical History  Diagnosis Date  . Hypertension   . Abnormal Pap smear of cervix 01-16-14    -pap nl but Pos.HR HPV/Colpo 02-20-14--LSIL--needs repeat pap 441yr.  Marland Kitchen. Hypercholesterolemia 2016  . Anxiety     Past Surgical History  Procedure Laterality Date  . Tonsillectomy and adenoidectomy      -age 35  . Toe surgery Bilateral     [place] child[place]    Current Outpatient Prescriptions  Medication Sig Dispense Refill  . levonorgestrel (MIRENA) 20 MCG/24HR IUD 1 each by Intrauterine route once.     No current  facility-administered medications for this visit.    Family History  Problem Relation Age of Onset  . Thyroid disease Mother   . Osteoarthritis Mother   . Hypertension Father   . Hypertension Brother   . Rheum arthritis Maternal Grandmother     ROS:  Pertinent items are noted in HPI.  Otherwise, a comprehensive ROS was negative.  Exam:   BP 110/70 mmHg  Pulse 90  Resp 22  Ht 5\' 2"  (1.575 m)  Wt 266 lb 6.4 oz (120.838 kg)  BMI 48.71 kg/m2    General appearance: alert, cooperative and appears stated age Head: Normocephalic, without obvious abnormality, atraumatic Neck: no adenopathy, supple, symmetrical, trachea midline and thyroid normal to inspection and palpation Lungs: clear to auscultation bilaterally Breasts: normal appearance, no masses or tenderness, Inspection negative, No nipple retraction or dimpling, No nipple discharge or bleeding, No axillary or supraclavicular adenopathy Heart: regular rate and rhythm Abdomen: incisions:  No.    , soft, non-tender; no masses, no organomegaly Extremities: extremities normal, atraumatic, no cyanosis or edema Skin: Skin color, texture, turgor normal. No rashes or lesions Lymph nodes: Cervical, supraclavicular, and axillary nodes normal. No abnormal inguinal nodes palpated Neurologic: Grossly normal  Pelvic: External genitalia:  no lesions              Urethra:  normal appearing urethra with no masses, tenderness  or lesions              Bartholins and Skenes: normal                 Vagina: normal appearing vagina with normal color and discharge, no lesions              Cervix: no lesions and IUD strings noted.              Pap taken: Yes.   Bimanual Exam:  Uterus:  normal size, contour, position, consistency, mobility, non-tender              Adnexa: normal adnexa and no mass, fullness, tenderness       Chaperone was present for exam.  Assessment:   Well woman visit with normal exam. Mirena IUD patient.  Pelvic pain and  irregular spotting.  Hx LGSIL. Possible PCOS. Anxiety.   HTN, hypercholesterolemia, morbid obesity.   Plan: Yearly mammogram recommended after age 78.  Recommended self breast exam.  Pap and HR HPV as above. Guidelines for Calcium, Vitamin D, regular exercise program including cardiovascular and weight bearing exercise. Labs performed.  No..   Declines STD testing.  Prescription medication(s) given.  No.  Return for pelvic ultrasound to check IUD. Declines course of estrogen to help with spotting.  Anxiety, HTN, hypercholesterolemia under care of PCP.  Follow up annually and prn.       After visit summary provided.

## 2016-03-28 ENCOUNTER — Encounter: Payer: Self-pay | Admitting: Obstetrics and Gynecology

## 2016-03-31 LAB — IPS PAP TEST WITH HPV

## 2016-05-19 ENCOUNTER — Telehealth: Payer: Self-pay | Admitting: Obstetrics and Gynecology

## 2016-05-19 NOTE — Telephone Encounter (Signed)
Called patient to review benefits for procedure. Left voicemail to call back and review. °

## 2017-01-07 DIAGNOSIS — L659 Nonscarring hair loss, unspecified: Secondary | ICD-10-CM | POA: Diagnosis not present

## 2017-01-18 DIAGNOSIS — L658 Other specified nonscarring hair loss: Secondary | ICD-10-CM | POA: Diagnosis not present

## 2017-03-25 DIAGNOSIS — Z0184 Encounter for antibody response examination: Secondary | ICD-10-CM | POA: Diagnosis not present

## 2017-03-25 DIAGNOSIS — Z7189 Other specified counseling: Secondary | ICD-10-CM | POA: Diagnosis not present

## 2017-03-31 ENCOUNTER — Ambulatory Visit (INDEPENDENT_AMBULATORY_CARE_PROVIDER_SITE_OTHER): Payer: 59 | Admitting: Obstetrics and Gynecology

## 2017-03-31 ENCOUNTER — Encounter: Payer: Self-pay | Admitting: Obstetrics and Gynecology

## 2017-03-31 VITALS — BP 122/78 | HR 72 | Resp 16 | Ht 61.75 in | Wt 215.0 lb

## 2017-03-31 DIAGNOSIS — Z01419 Encounter for gynecological examination (general) (routine) without abnormal findings: Secondary | ICD-10-CM | POA: Diagnosis not present

## 2017-03-31 NOTE — Progress Notes (Signed)
36 y.o. G0P0 Married Caucasian female here for annual exam.    Considering removal of the Mirena IUD.  Menses prior to the IUD were controlled with birth control pills.  Lost 60 pounds.  Wants to loose more.  Hair loss after her weight loss.   See PCP for blood work. Full thyroid testing was normal.  Normal cholesterol and blood sugar.  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  She had adequate abys for [place].   PCP:   Orvan JulyFrances Wong, MD  No LMP recorded (lmp unknown). Patient is not currently having periods (Reason: IUD).           Sexually active: Yes.    The current method of family planning is IUD--Mirena inserted 08/23/14.    Exercising: Yes.    crossfit Smoker:  Former  Health Maintenance: Pap:  03/26/16 Pap and HR HPV negative  01-29-15 Neg:Neg HR HPV History of abnormal Pap:  Yes, 01-16-14 wnl:Pos. HR HPV, Colposcopy 02-20-14 LGSIL of exocervix and neg. ECC--no treatment. MMG:  n/a Colonoscopy:  n/a BMD:   n/a  Result  n/a TDaP:  2015 Gardasil:   no HIV: 01/16/14 Negative Hep C: not done Screening Labs:  Hb today: PCP takes care of labs   reports that she has quit smoking. She has never used smokeless tobacco. She reports that she drinks about 1.2 oz of alcohol per week . She reports that she does not use drugs.  Past Medical History:  Diagnosis Date  . Abnormal Pap smear of cervix 01-16-14   -pap nl but Pos.HR HPV/Colpo 02-20-14--LSIL--needs repeat pap 6685yr.  Debra [name]. Anxiety   . Hypercholesterolemia 2016  . Hypertension     Past Surgical History:  Procedure Laterality Date  . TOE SURGERY Bilateral    -as child[place]in [place]  . TONSILLECTOMY AND ADENOIDECTOMY     -age 36    Current Outpatient Prescriptions  Medication Sig Dispense Refill  . BIOTIN PO Take by mouth daily.    Debra [name]. levonorgestrel (MIRENA) 20 MCG/24HR IUD 1 each by Intrauterine route once.    . Omega-3 Fatty Acids (FISH OIL) 1000 MG CAPS Take by mouth daily.     No current  facility-administered medications for this visit.     Family History  Problem Relation Age of Onset  . Thyroid disease Mother   . Osteoarthritis Mother   . Hypertension Father   . Hypertension Brother   . Rheum arthritis Maternal Grandmother     ROS:  Pertinent items are noted in HPI.  Otherwise, a comprehensive ROS was negative.  Exam:   BP 122/78 (BP Location: Right Arm, Patient Position: Sitting, Cuff Size: Normal)   Pulse 72   Resp 16   Ht 5' 1.75" (1.568 m)   Wt 215 lb (97.5 kg)   LMP  (LMP Unknown)   BMI 39.64 kg/m     General appearance: alert, cooperative and appears stated age Head: Normocephalic, without obvious abnormality, atraumatic Neck: no adenopathy, supple, symmetrical, trachea midline and thyroid normal to inspection and palpation Lungs: clear to auscultation bilaterally Breasts: normal appearance, no masses or tenderness, No nipple retraction or dimpling, No nipple discharge or bleeding, No axillary or supraclavicular adenopathy Heart: regular rate and rhythm Abdomen: soft, non-tender; no masses, no organomegaly Extremities: extremities normal, atraumatic, no cyanosis or edema Skin: Skin color, texture, turgor normal. No rashes or lesions Lymph nodes: Cervical, supraclavicular, and axillary nodes normal. No abnormal inguinal nodes palpated Neurologic: Grossly normal  Pelvic: External genitalia:  no lesions  Urethra:  normal appearing urethra with no masses, tenderness or lesions              Bartholins and Skenes: normal                 Vagina: normal appearing vagina with normal color and discharge, no lesions              Cervix: no lesions.. IUD strings noted.               Pap taken: No. Bimanual Exam:  Uterus:  normal size, contour, position, consistency, mobility, non-tender              Adnexa: no mass, fullness, tenderness          Chaperone was present for exam.  Assessment:   Well woman visit with normal exam. Mirena IUD  patient.  Pelvic pain and irregular spotting.   Hx LGSIL.  Two follow up paps normal.  Possible PCOS. Obesity. Undergoing successful weight loss.   Plan: Mammogram screening discussed. Recommended self breast awareness. Pap and HR HPV as above. Guidelines for Calcium, Vitamin D, regular exercise program including cardiovascular and weight bearing exercise. Continue with weight loss to improve fertility and reduce risk of DM, HTN, and potential cardiovascular disease. Start PNV.  Return for IUD removal prn.  Discussed AMA status.  Follow up annually and prn.   After visit summary provided.

## 2017-03-31 NOTE — Patient Instructions (Signed)

## 2017-04-23 DIAGNOSIS — J028 Acute pharyngitis due to other specified organisms: Secondary | ICD-10-CM | POA: Diagnosis not present

## 2017-04-29 DIAGNOSIS — J3489 Other specified disorders of nose and nasal sinuses: Secondary | ICD-10-CM | POA: Diagnosis not present

## 2017-04-29 DIAGNOSIS — H6692 Otitis media, unspecified, left ear: Secondary | ICD-10-CM | POA: Diagnosis not present

## 2017-05-13 DIAGNOSIS — H6123 Impacted cerumen, bilateral: Secondary | ICD-10-CM | POA: Diagnosis not present

## 2017-05-13 DIAGNOSIS — J302 Other seasonal allergic rhinitis: Secondary | ICD-10-CM | POA: Diagnosis not present

## 2017-05-13 DIAGNOSIS — H6981 Other specified disorders of Eustachian tube, right ear: Secondary | ICD-10-CM | POA: Diagnosis not present

## 2017-06-23 DIAGNOSIS — Z719 Counseling, unspecified: Secondary | ICD-10-CM | POA: Diagnosis not present

## 2017-06-28 DIAGNOSIS — L650 Telogen effluvium: Secondary | ICD-10-CM | POA: Diagnosis not present

## 2017-07-05 DIAGNOSIS — G8929 Other chronic pain: Secondary | ICD-10-CM | POA: Diagnosis not present

## 2017-07-05 DIAGNOSIS — M19011 Primary osteoarthritis, right shoulder: Secondary | ICD-10-CM | POA: Diagnosis not present

## 2017-07-05 DIAGNOSIS — M25511 Pain in right shoulder: Secondary | ICD-10-CM | POA: Diagnosis not present

## 2017-07-09 HISTORY — PX: ROTATOR CUFF REPAIR: SHX139

## 2017-07-12 DIAGNOSIS — M25511 Pain in right shoulder: Secondary | ICD-10-CM | POA: Diagnosis not present

## 2017-07-12 DIAGNOSIS — G8929 Other chronic pain: Secondary | ICD-10-CM | POA: Diagnosis not present

## 2017-07-19 DIAGNOSIS — M19011 Primary osteoarthritis, right shoulder: Secondary | ICD-10-CM | POA: Diagnosis not present

## 2017-07-19 DIAGNOSIS — M25511 Pain in right shoulder: Secondary | ICD-10-CM | POA: Diagnosis not present

## 2017-07-19 DIAGNOSIS — G8929 Other chronic pain: Secondary | ICD-10-CM | POA: Diagnosis not present

## 2017-08-01 DIAGNOSIS — M19011 Primary osteoarthritis, right shoulder: Secondary | ICD-10-CM | POA: Diagnosis not present

## 2017-08-01 DIAGNOSIS — G8918 Other acute postprocedural pain: Secondary | ICD-10-CM | POA: Diagnosis not present

## 2017-08-01 DIAGNOSIS — M75111 Incomplete rotator cuff tear or rupture of right shoulder, not specified as traumatic: Secondary | ICD-10-CM | POA: Diagnosis not present

## 2017-08-01 DIAGNOSIS — M66811 Spontaneous rupture of other tendons, right shoulder: Secondary | ICD-10-CM | POA: Diagnosis not present

## 2017-08-04 DIAGNOSIS — M19011 Primary osteoarthritis, right shoulder: Secondary | ICD-10-CM | POA: Diagnosis not present

## 2017-08-09 DIAGNOSIS — M19011 Primary osteoarthritis, right shoulder: Secondary | ICD-10-CM | POA: Diagnosis not present

## 2017-08-12 DIAGNOSIS — M19011 Primary osteoarthritis, right shoulder: Secondary | ICD-10-CM | POA: Diagnosis not present

## 2017-08-19 DIAGNOSIS — M19011 Primary osteoarthritis, right shoulder: Secondary | ICD-10-CM | POA: Diagnosis not present

## 2017-08-26 DIAGNOSIS — M19011 Primary osteoarthritis, right shoulder: Secondary | ICD-10-CM | POA: Diagnosis not present

## 2017-08-30 DIAGNOSIS — M19011 Primary osteoarthritis, right shoulder: Secondary | ICD-10-CM | POA: Diagnosis not present

## 2017-09-06 ENCOUNTER — Telehealth: Payer: Self-pay | Admitting: Obstetrics and Gynecology

## 2017-09-06 DIAGNOSIS — M19011 Primary osteoarthritis, right shoulder: Secondary | ICD-10-CM | POA: Diagnosis not present

## 2017-09-06 NOTE — Telephone Encounter (Signed)
Left message to call Shamiah Kahler at [phone]. 

## 2017-09-06 NOTE — Telephone Encounter (Signed)
Patient called requesting an appointment to remove her IUD. She does not want another one inserted.

## 2017-09-12 NOTE — Telephone Encounter (Signed)
Left message to call Kaitlyn at [phone]. 

## 2017-09-12 NOTE — Telephone Encounter (Signed)
Left message to call Mallika Sanmiguel at [phone]. 

## 2017-09-13 NOTE — Telephone Encounter (Signed)
Left message to call Debra [name] at [phone]. 

## 2017-09-13 NOTE — Telephone Encounter (Signed)
Return call to Kaitlyn. °

## 2017-09-13 NOTE — Telephone Encounter (Signed)
Spoke with patient. IUD removal scheduled for 09/27/2017 at 3 pm with Dr.Silva. Patient is agreeable to date and time.  Reviewed with Kennon RoundsSally and MD will be in [place] day that day. Left message for patient to return call and reschedule appointment.

## 2017-09-14 NOTE — Telephone Encounter (Signed)
Spoke with patient. IUD removal appointment moved to 10/07/2017 at 3 am with Dr.Silva. Patient verbalizes understanding.  Routing to provider for final review. Patient agreeable to disposition. Will close encounter.

## 2017-09-27 ENCOUNTER — Ambulatory Visit: Payer: 59 | Admitting: Obstetrics and Gynecology

## 2017-09-27 DIAGNOSIS — M19011 Primary osteoarthritis, right shoulder: Secondary | ICD-10-CM | POA: Diagnosis not present

## 2017-10-05 ENCOUNTER — Telehealth: Payer: Self-pay | Admitting: Obstetrics and Gynecology

## 2017-10-05 NOTE — Telephone Encounter (Signed)
Please reschedule appointment for iud removal that was scheduled with Dr Edward JollySilva on 11/30.

## 2017-10-05 NOTE — Telephone Encounter (Signed)
Left message to call Kaitlyn at [phone]. 

## 2017-10-05 NOTE — Telephone Encounter (Signed)
Patient's appointment for IUD removal rescheduled to 10/13/2017 at 2:30 pm with Dr.Silva. Patient is agreeable to date and time. Encounter closed.

## 2017-10-07 ENCOUNTER — Ambulatory Visit: Payer: 59 | Admitting: Obstetrics and Gynecology

## 2017-10-13 ENCOUNTER — Encounter: Payer: Self-pay | Admitting: Obstetrics and Gynecology

## 2017-10-13 ENCOUNTER — Ambulatory Visit (INDEPENDENT_AMBULATORY_CARE_PROVIDER_SITE_OTHER): Payer: 59 | Admitting: Obstetrics and Gynecology

## 2017-10-13 VITALS — BP 140/84 | HR 76 | Ht 61.75 in | Wt 240.0 lb

## 2017-10-13 DIAGNOSIS — Z3169 Encounter for other general counseling and advice on procreation: Secondary | ICD-10-CM

## 2017-10-13 DIAGNOSIS — Z30432 Encounter for removal of intrauterine contraceptive device: Secondary | ICD-10-CM

## 2017-10-13 NOTE — Progress Notes (Signed)
GYNECOLOGY  VISIT   HPI: 36 y.o.   Married  Caucasian  female   G0P0 with No LMP recorded. Patient is not currently having periods (Reason: IUD).   here for Mirena IUD removal.    Thinking about childbearing.   Taking a multivitamin.   Weight gain with shoulder surgery.  Now exercising again.   Just started Zoloft for anxiety.  Applying for her green card.  Has to stop working due to change in her visa status.  Up to date with routine vaccines other than flu.   PCP - Dr. Scarlette CalicoFrances Wong/[facility] [place]  GYNECOLOGIC HISTORY: No LMP recorded. Patient is not currently having periods (Reason: IUD). Contraception:  Mirena IUD inserted 08-23-14 Menopausal hormone therapy:  n/a Last mammogram:  n/a Last pap smear:  03/26/16 Pap and HR HPV negative             01-29-15 Neg:Neg HR HPV        OB History    Gravida Para Term Preterm AB Living   0             SAB TAB Ectopic Multiple Live Births                     There are no active problems to display for this patient.   Past Medical History:  Diagnosis Date  . Abnormal Pap smear of cervix 01-16-14   -pap nl but Pos.HR HPV/Colpo 02-20-14--LSIL--needs repeat pap 7370yr.  Marland Kitchen. Anxiety    in therapy again 10/2017  . Hypercholesterolemia 2016  . Hypertension     Past Surgical History:  Procedure Laterality Date  . ROTATOR CUFF REPAIR Right 07/2017   Dr.Norris--[facility]--bone spur/arthritis  . TOE SURGERY Bilateral    -as child[place]  . TONSILLECTOMY AND ADENOIDECTOMY     -age 36    Current Outpatient Medications  Medication Sig Dispense Refill  . BIOTIN PO Take by mouth daily.    Marland Kitchen. levonorgestrel (MIRENA) 20 MCG/24HR IUD 1 each by Intrauterine route once.    Marland Kitchen. LORazepam (ATIVAN) 0.5 MG tablet Take 1 tablet by mouth as needed.    . Multiple Vitamins-Iron (MULTIVITAMINS WITH IRON) TABS tablet Take 1 tablet by mouth daily.    . Omega-3 Fatty Acids (FISH OIL) 1000 MG CAPS Take by mouth daily.    . sertraline (ZOLOFT)  25 MG tablet Take 1 tablet by mouth daily.  0   No current facility-administered medications for this visit.      ALLERGIES: Patient has no known allergies.  Family History  Problem Relation Age of Onset  . Thyroid disease Mother   . Osteoarthritis Mother   . Hypertension Father   . Hypertension Brother   . Rheum arthritis Maternal Grandmother     Social History   Socioeconomic History  . Marital status: Married    Spouse name: Not on file  . Number of children: Not on file  . Years of education: Not on file  . Highest education level: Not on file  Social Needs  . Financial resource strain: Not on file  . Food insecurity - worry: Not on file  . Food insecurity - inability: Not on file  . Transportation needs - medical: Not on file  . Transportation needs - non-medical: Not on file  Occupational History  . Not on file  Tobacco Use  . Smoking status: Former Games developermoker  . Smokeless tobacco: Never Used  Substance and Sexual Activity  . Alcohol use:  Yes    Alcohol/week: 1.2 oz    Types: 2 Standard drinks or equivalent per week  . Drug use: No  . Sexual activity: Yes    Partners: Male    Birth control/protection: IUD    Comment: Mirena IUD--inserted 08-23-14  Other Topics Concern  . Not on file  Social History Narrative  . Not on file    ROS:  Pertinent items are noted in HPI.  PHYSICAL EXAMINATION:    BP 140/84 (BP Location: Right Arm, Patient Position: Sitting, Cuff Size: Large)   Pulse 76   Ht 5' 1.75" (1.568 m)   Wt 240 lb (108.9 kg)   BMI 44.25 kg/m     General appearance: alert, cooperative and appears stated age   Pelvic: External genitalia:  no lesions              Urethra:  normal appearing urethra with no masses, tenderness or lesions              Bartholins and Skenes: normal                 Vagina: normal appearing vagina with normal color and discharge, no lesions              Cervix: no lesions                Bimanual Exam:  Uterus:  normal  size, contour, position, consistency, mobility, non-tender              Adnexa: no mass, fullness, tenderness               IUD removed with ring forceps after verbal permission given.   Chaperone was present for exam.  ASSESSMENT  IUD removal.  Hx PCOS.  Stress.  Newly on Zoloft.  Desire for future childbearing.  PLAN  Start PNV.  Ok to continue Zoloft.  Discussed increased risk of PP hemorrhage.  Avoid exposures.  Do flu vaccine.  Call for irregular cycles.  We discussed the possibility of Clomid or Letrozole for ovulation induction. We reviewed reading materials in preparation for pregnancy.     An After Visit Summary was printed and given to the patient.  __15____ minutes face to face time of which over 50% was spent in counseling.

## 2017-11-03 DIAGNOSIS — Z4789 Encounter for other orthopedic aftercare: Secondary | ICD-10-CM | POA: Diagnosis not present

## 2017-11-03 DIAGNOSIS — G8929 Other chronic pain: Secondary | ICD-10-CM | POA: Diagnosis not present

## 2017-11-03 DIAGNOSIS — M25511 Pain in right shoulder: Secondary | ICD-10-CM | POA: Diagnosis not present

## 2017-11-22 DIAGNOSIS — M25511 Pain in right shoulder: Secondary | ICD-10-CM | POA: Insufficient documentation

## 2017-11-22 DIAGNOSIS — Z9889 Other specified postprocedural states: Secondary | ICD-10-CM | POA: Insufficient documentation

## 2018-04-06 ENCOUNTER — Other Ambulatory Visit: Payer: Self-pay

## 2018-04-06 ENCOUNTER — Ambulatory Visit: Payer: BLUE CROSS/BLUE SHIELD | Attending: Orthopedic Surgery | Admitting: Physical Therapy

## 2018-04-06 DIAGNOSIS — G8929 Other chronic pain: Secondary | ICD-10-CM | POA: Diagnosis present

## 2018-04-06 DIAGNOSIS — M25511 Pain in right shoulder: Secondary | ICD-10-CM | POA: Diagnosis present

## 2018-04-06 DIAGNOSIS — M25611 Stiffness of right shoulder, not elsewhere classified: Secondary | ICD-10-CM | POA: Diagnosis present

## 2018-04-06 NOTE — Therapy (Signed)
[facility] [place] [address] [place], [place], [address] Phone: [phone]   Fax:  [phone]  Physical Therapy Evaluation  Patient Details  Name: Debra [name] MRN: [id number] Date of Birth: May 17, 1981 Referring Provider: Beverely Low   Encounter Date: 04/06/2018  PT End of Session - 04/06/18 1347    Visit Number  1    Number of Visits  8    Date for PT Re-Evaluation  05/04/18    PT Start Time  1347    PT Stop Time  1420    PT Time Calculation (min)  33 min    Activity Tolerance  Patient tolerated treatment well    Behavior During Therapy  [facility] for tasks assessed/performed       Past Medical History:  Diagnosis Date  . Abnormal Pap smear of cervix 01-16-14   -pap nl but Pos.HR HPV/Colpo 02-20-14--LSIL--needs repeat pap 30yr.  Marland Kitchen Anxiety    in therapy again 10/2017  . Hypercholesterolemia 2016  . Hypertension     Past Surgical History:  Procedure Laterality Date  . ROTATOR CUFF REPAIR Right 07/2017   Dr.Norris--[facility]--bone spur/arthritis  . TOE SURGERY Bilateral    -as child--in [place]  . TONSILLECTOMY AND ADENOIDECTOMY     -age 63    There were no vitals filed for this visit.   Subjective Assessment - 04/06/18 1423    Subjective  Patient reports increasing pain in R shoulder in past few weeks. She had shoulder arthroscopy with RCR in September 2018. She is very active athletically and is concerned about the pain.    Diagnostic tests  MRI shows inflammation per pt    Patient Stated Goals  to decrease pain    Currently in Pain?  Yes    Pain Score  2     Pain Location  Shoulder    Pain Orientation  Right    Pain Descriptors / Indicators  Sharp    Pain Type  Chronic pain    Pain Onset  More than a month ago    Pain Frequency  Constant    Aggravating Factors   bench press, pushups    Pain Relieving Factors  rest    Effect of Pain on Daily Activities  limited with athletic activities          [facility] PT Assessment - 04/06/18 0001      Assessment   Medical Diagnosis  S/P R shoulder arthroscopy    Referring Provider  Beverely Low    Onset Date/Surgical Date  08/06/17    Hand Dominance  Right    Next MD Visit  early july    Prior Therapy  yes post surgery      Precautions   Precautions  None      Balance Screen   Has the patient fallen in the past 6 months  No    Has the patient had a decrease in activity level because of a fear of falling?   No    Is the patient reluctant to leave their home because of a fear of falling?   No      Prior Function   Level of Independence  Independent    Vocation  Full time employment    Vocation Requirements  desk job      Observation/Other Assessments   Focus on Therapeutic Outcomes (FOTO)   37 % LIMITED      Posture/Postural Control   Posture Comments  depressed right  shoulder, rounded shoulders, Tight left QL      ROM / Strength   AROM / PROM / Strength  AROM;PROM;Strength      AROM   AROM Assessment Site  Shoulder    Right/Left Shoulder  Right    Right Shoulder Extension  -- full'    Right Shoulder Flexion  164 Degrees 178 passive    Right Shoulder ABduction  180 Degrees    Right Shoulder Internal Rotation  46 Degrees at 90 ABD; 48 deg passive painful at end range    Right Shoulder External Rotation  90 Degrees Able to reach behnd back      Strength   Overall Strength Comments  R mid traps/low traps 4+/5 with pain; rhomboids 5/5 mild pain    Strength Assessment Site  Shoulder    Right/Left Shoulder  Right    Right Shoulder Flexion  5/5    Right Shoulder Extension  5/5    Right Shoulder ABduction  5/5    Right Shoulder Internal Rotation  5/5    Right Shoulder External Rotation  5/5    Right Shoulder Horizontal ABduction  4+/5    Right Shoulder Horizontal ADduction  4/5      Palpation   Palpation comment  pin point pain at R A/C joint      Special Tests   Other special tests  positive impingement sign; neg H/K                 Objective measurements completed on examination: See above findings.      [facility] Adult PT Treatment/Exercise - 04/06/18 0001      Modalities   Modalities  Iontophoresis      Iontophoresis   Type of Iontophoresis  Dexamethasone    Location  R shoulder    Dose  1.0 ml    Time  4 hour patch             PT Education - 04/06/18 1543    Education Details  HEP; education on iontophoresis precautions/contraindications    Person(s) Educated  Patient    Methods  Explanation;Demonstration;Handout    Comprehension  Verbalized understanding          PT Long Term Goals - 04/06/18 1559      PT LONG TERM GOAL #1   Title  Patient to report decrease in shoulder pain by 70% or more with ADLS.    Time  4    Period  Weeks    Status  New      PT LONG TERM GOAL #2   Title  Improved R shoulder IR to 70 degrees to improve function    Time  4    Period  Weeks    Status  New             Plan - 04/06/18 1545    Clinical Impression Statement  Patient had R shoulder surgery in September 2018 including RCR. She has had a recent increase in pain with ADLS and athletic activity. Her ROM is limited only in IR and end range flexion. Her strength is 5/5 with MMT except mid/low traps. She will benefit from PT to address these deficits.     History and Personal Factors relevant to plan of care:  RCR 9/18    Clinical Presentation  Stable    Clinical Decision Making  Low    Rehab Potential  Excellent    PT Frequency  2x / week    PT  Duration  4 weeks    PT Treatment/Interventions  ADLs/Self Care Home Management;Cryotherapy;Iontophoresis /ml Dexamethasone;Electrical Stimulation;Moist Heat;Ultrasound;Therapeutic exercise;Therapeutic activities;Neuromuscular re-education;Patient/family education;Manual techniques;Dry needling;Taping    PT Next Visit Plan  posterior capsule stretching per order, mid/low traps and PNF strengthening, pec stretches, manual prn ;  iontophoresis    PT Home Exercise Plan  doorway stretch    Consulted and Agree with Plan of Care  Patient       Patient will benefit from skilled therapeutic intervention in order to improve the following deficits and impairments:  Pain, Decreased range of motion, Decreased strength, Impaired flexibility  Visit Diagnosis: Chronic right shoulder pain - Plan: PT plan of care cert/re-cert  Stiffness of right shoulder, not elsewhere classified - Plan: PT plan of care cert/re-cert     Problem List There are no active problems to display for this patient.   Bethene Hankinson PT 04/06/2018, 4:03 PM  [facility] [place] [address] [place], [place], [address] Phone: [phone]   Fax:  [phone]  Name: Debra [name] MRN: [id number] Date of Birth: August 24, 1981

## 2018-04-06 NOTE — Patient Instructions (Signed)

## 2018-04-07 ENCOUNTER — Ambulatory Visit: Payer: 59 | Admitting: Obstetrics and Gynecology

## 2018-04-10 ENCOUNTER — Ambulatory Visit: Payer: BLUE CROSS/BLUE SHIELD | Attending: Orthopedic Surgery | Admitting: Physical Therapy

## 2018-04-10 ENCOUNTER — Encounter: Payer: Self-pay | Admitting: Physical Therapy

## 2018-04-10 DIAGNOSIS — M25611 Stiffness of right shoulder, not elsewhere classified: Secondary | ICD-10-CM | POA: Insufficient documentation

## 2018-04-10 DIAGNOSIS — M25511 Pain in right shoulder: Secondary | ICD-10-CM | POA: Insufficient documentation

## 2018-04-10 DIAGNOSIS — G8929 Other chronic pain: Secondary | ICD-10-CM | POA: Diagnosis present

## 2018-04-10 NOTE — Therapy (Signed)
[facility] [address] [place], [place], [address] Phone: [phone]   Fax:  [phone]  Physical Therapy Treatment  Patient Details  Name: Debra LahBettina [name] MRN: [id number] Date of Birth: [birth year] Referring Provider: Beverely LowSteve Norris   Encounter Date: 04/10/2018  PT End of Session - 04/10/18 1556    Visit Number  2    Number of Visits  8    Date for PT Re-Evaluation  05/04/18    PT Start Time  1515    PT Stop Time  1556    PT Time Calculation (min)  41 min    Activity Tolerance  Patient tolerated treatment well    Behavior During Therapy  [facility] for tasks assessed/performed       Past Medical History:  Diagnosis Date  . Abnormal Pap smear of cervix 01-16-14   -pap nl but Pos.HR HPV/Colpo 02-20-14--LSIL--needs repeat pap 5046yr.  Marland Kitchen. Anxiety    in therapy again 10/2017  . Hypercholesterolemia 2016  . Hypertension     Past Surgical History:  Procedure Laterality Date  . ROTATOR CUFF REPAIR Right 07/2017   Dr.Norris--[facility]--bone spur/arthritis  . TOE SURGERY Bilateral    -as child--in [place]  . TONSILLECTOMY AND ADENOIDECTOMY     -age 37    There were no vitals filed for this visit.  Subjective Assessment - 04/10/18 1519    Subjective  "Pain" Pt reports she sleeps on her side so soreness in the morning. Pain with some activities    Currently in Pain?  Yes    Pain Score  2     Pain Location  Shoulder    Pain Orientation  Right                       [facility] Adult PT Treatment/Exercise - 04/10/18 0001      Exercises   Exercises  Shoulder      Shoulder Exercises: Seated   Other Seated Exercises  bent over rows, rev flys 3lb 2x10      Shoulder Exercises: Standing   External Rotation  10 reps;Theraband;Both x2    Theraband Level (Shoulder External Rotation)  Level 2 (Red)    Internal Rotation  Right;Theraband;10 reps x2    Theraband Level (Shoulder Internal Rotation)  Level 2 (Red)    Flexion  Right;10 reps;Theraband x2    Theraband Level (Shoulder Flexion)  Level 2 (Red)    ABduction  10 reps;Theraband;Right x2    Theraband Level (Shoulder ABduction)  Level 2 (Red)    Other Standing Exercises  back againt wall overhead flexion 2x10     Other Standing Exercises  4 way scap stab 2lb x 10 each, flex abd combo red 2x5      Shoulder Exercises: Stretch   Cross Chest Stretch  5 reps;10 seconds    Other Shoulder Stretches  sleepers stretch      Modalities   Modalities  Iontophoresis      Iontophoresis   Type of Iontophoresis  Dexamethasone    Location  R shoulder    Dose  1.0 ml    Time  4 hour patch      Manual Therapy   Manual Therapy  Passive ROM;Joint mobilization    Joint Mobilization  R GH grased 2-3     Passive ROM  R shoulder in all planes                  PT  Long Term Goals - 04/06/18 1559      PT LONG TERM GOAL #1   Title  Patient to report decrease in shoulder pain by 70% or more with ADLS.    Time  4    Period  Weeks    Status  New      PT LONG TERM GOAL #2   Title  Improved R shoulder IR to 70 degrees to improve function    Time  4    Period  Weeks    Status  New            Plan - 04/10/18 1556    Clinical Impression Statement  Pt enters clinic reporting tolerable pain. She stated that she has continued to be active taking kick boxing classes. All exercises completed today's with good strength and ROM, but she does reports shoulder pain with some exercises. Tightness reported with cross the chest and sleepers stretch. Pt has full PROM.    Rehab Potential  Excellent    PT Frequency  2x / week    PT Duration  4 weeks    PT Next Visit Plan  posterior capsule stretching per order, mid/low traps and PNF strengthening, pec stretches, manual prn ; iontophoresis       Patient will benefit from skilled therapeutic intervention in order to improve the following deficits and impairments:  Pain, Decreased range of motion, Decreased  strength, Impaired flexibility  Visit Diagnosis: Stiffness of right shoulder, not elsewhere classified  Chronic right shoulder pain     Problem List There are no active problems to display for this patient.   Grayce Sessions, PTA 04/10/2018, 4:00 PM  [facility] [place] [address] [place], [place], [address] Phone: [phone]   Fax:  [phone]  Name: Debra [name] MRN: [id number] Date of Birth: 04-Nov-1981

## 2018-04-11 ENCOUNTER — Ambulatory Visit: Payer: Self-pay | Admitting: Obstetrics and Gynecology

## 2018-04-12 ENCOUNTER — Ambulatory Visit: Payer: BLUE CROSS/BLUE SHIELD | Admitting: Certified Nurse Midwife

## 2018-04-12 ENCOUNTER — Other Ambulatory Visit: Payer: Self-pay

## 2018-04-12 ENCOUNTER — Encounter: Payer: Self-pay | Admitting: Certified Nurse Midwife

## 2018-04-12 ENCOUNTER — Ambulatory Visit: Payer: BLUE CROSS/BLUE SHIELD | Admitting: Physical Therapy

## 2018-04-12 VITALS — BP 114/80 | HR 70 | Resp 16 | Ht 62.0 in | Wt 257.0 lb

## 2018-04-12 DIAGNOSIS — Z3201 Encounter for pregnancy test, result positive: Secondary | ICD-10-CM | POA: Diagnosis not present

## 2018-04-12 DIAGNOSIS — F419 Anxiety disorder, unspecified: Secondary | ICD-10-CM | POA: Insufficient documentation

## 2018-04-12 DIAGNOSIS — N912 Amenorrhea, unspecified: Secondary | ICD-10-CM | POA: Diagnosis not present

## 2018-04-12 LAB — POCT URINE PREGNANCY: PREG TEST UR: POSITIVE — AB

## 2018-04-12 NOTE — Patient Instructions (Signed)

## 2018-04-12 NOTE — Progress Notes (Addendum)
37 y.o. married white female  g0p0 presents with amenorrhea with + UPT on 04/10/18. LMP 03/07/18 with 6 day duration. Planned pregnancy. Had Mirena IUD removal on 10/13/18. Feels conception 03/25/18 with 29 day cycle. Complaining of fatigue, and some cramping like period would start. Denies spotting, bleeding  Medications she is taking are: Zoloft 50 mg for anxiety(was told Ok for pregnancy). Patient plans to discuss with OB once care intiated. Patient has not consumed alcohol since + UPT.  Had glass of wine one week ago only. Spouse supportive.  Review of Systems  Constitutional: Negative for malaise/fatigue.  Gastrointestinal: Negative for abdominal pain, nausea and vomiting.   Pertinent to HPI  O: HPI pertinent to above. Healthy WDWN female Affect: normal, orientation x 3  Last Aex: 03/31/17 normal Pap smear:  03/26/16 normal HPV negative            Rubella screen: recent immigration testing in 2018 with normal titer of Rubella  A: Amenorrhea with positive UPT  5 wk 0 days per LNMP with EDC 12/13/2018. Planned pregnancy   P: Reviewed with patient importance of prenatal care during pregnancy. Given OB provider list. Reviewed nutrition importance of pregnancy and selecting from all food groups and making sure to have adequate protein intake daily. Discussed avoiding raw or exotic fish, soft cheeses due to risk of bacteria . Discussed concerns with FAS with alcohol use in pregnancy. Discussed increase of IUGR and SIDS with smoking use or second smoke. Reviewed warning signs of early pregnancy and need to advise if occurs. Also discussed possible [facility]. Patient aware. Patient had recommendation to use [facility] practice and planning to call for appointment after discussing with spouse. Discussed comfort measures for early pregnancy changes. Offered viability PUS here prior to initiating prenatal care. Patient will advise if she plans to have PUS, [facility]. She will be called with insurance information and scheduled if she decides to have here. Warning sighs for early pregnancy with bleeding and cramping given. Will call if change. Questions addressed at length.  Labs: none  Rv prn  Time spent with patient in face to face counseling regarding pregnancy and prenatal care 28 minutes

## 2018-04-16 NOTE — Progress Notes (Signed)
Patient did not report cramping that sounded concerning. Wanted one PUS with OB unless notified us. Had warning signs reviewed if change.

## 2018-04-19 ENCOUNTER — Encounter: Payer: Self-pay | Admitting: Physical Therapy

## 2018-04-19 ENCOUNTER — Ambulatory Visit: Payer: BLUE CROSS/BLUE SHIELD | Admitting: Physical Therapy

## 2018-04-19 DIAGNOSIS — M25611 Stiffness of right shoulder, not elsewhere classified: Secondary | ICD-10-CM | POA: Diagnosis not present

## 2018-04-19 DIAGNOSIS — M25511 Pain in right shoulder: Secondary | ICD-10-CM

## 2018-04-19 DIAGNOSIS — G8929 Other chronic pain: Secondary | ICD-10-CM

## 2018-04-19 NOTE — Therapy (Signed)
[facility] [address] [place], [place], [address] Phone: [phone]   Fax:  [phone]  Physical Therapy Treatment  Patient Details  Name: Debra [name] MRN: [id number] Date of Birth: [birth year] Referring Provider: Netta Cedars   Encounter Date: 04/19/2018  PT End of Session - 04/19/18 1342    Visit Number  3    Date for PT Re-Evaluation  05/04/18    PT Start Time  1300    PT Stop Time  1342    PT Time Calculation (min)  42 min       Past Medical History:  Diagnosis Date  . Abnormal Pap smear of cervix 01-16-14   -pap nl but Pos.HR HPV/Colpo 02-20-14--LSIL--needs repeat pap 27yr  .Marland KitchenAnxiety    in therapy again 10/2017  . Hypercholesterolemia 2016  . Hypertension     Past Surgical History:  Procedure Laterality Date  . ROTATOR CUFF REPAIR Right 07/2017   Dr.Norris--[facility]--bone spur/arthritis  . TOE SURGERY Bilateral    -[place] child[place] . TONSILLECTOMY AND ADENOIDECTOMY     -age [age]   There were no vitals filed for this visit.  Subjective Assessment - 04/19/18 1305    Subjective  "It has been better, I really didn't do kickboxing or anything, it is better but not gone" Pt also reports that she just found out she was six weeks pregnant    Currently in Pain?  No/denies    Pain Score  0-No pain                       [facility] Adult PT Treatment/Exercise - 04/19/18 0001      Shoulder Exercises: Standing   External Rotation  10 reps;Theraband;Both x2    Theraband Level (Shoulder External Rotation)  Level 2 (Red)    Internal Rotation  Right;Theraband;10 reps x2    Theraband Level (Shoulder Internal Rotation)  Level 2 (Red)    Extension  Theraband;15 reps;Both x2    Theraband Level (Shoulder Extension)  Level 3 (Green)    Row  Both;Theraband;15 reps x2    Other Standing Exercises  back againt wall overhead flexion 2x10     Other Standing Exercises  Flex and abd 3lb 2x10        Shoulder Exercises: ROM/Strengthening   Other ROM/Strengthening Exercises  Rows & lats 25lb 2x10 each      Modalities   Modalities  Iontophoresis      Iontophoresis   Type of Iontophoresis  Dexamethasone    Location  R shoulder    Dose  1.0 ml    Time  4 hour patch      Manual Therapy   Manual Therapy  Passive ROM;Joint mobilization    Manual therapy comments  full PROM    Joint Mobilization  Jt distraction    Passive ROM  R shoulder in all planes                  PT Long Term Goals - 04/19/18 1344      PT LONG TERM GOAL #1   Title  Patient to report decrease in shoulder pain by 70% or more with ADLS.    Status  Partially Met      PT LONG TERM GOAL #2   Title  Improved R shoulder IR to 70 degrees to improve function    Status  On-going  Plan - 04/19/18 1342    Clinical Impression Statement  Pt reports decrease pain overall. All exercises completed well. Initial PROM Pt reported a pinching sensation at  end range of flexion an abduction. The pinching sensation decrease after some jt distraction. R shoulder does have some tightness with Passive IR.    Rehab Potential  Excellent    PT Frequency  2x / week    PT Duration  4 weeks    PT Treatment/Interventions  ADLs/Self Care Home Management;Cryotherapy;Iontophoresis 4m/ml Dexamethasone;Electrical Stimulation;Moist Heat;Ultrasound;Therapeutic exercise;Therapeutic activities;Neuromuscular re-education;Patient/family education;Manual techniques;Dry needling;Taping    PT Next Visit Plan  posterior capsule stretching per order, mid/low traps and PNF strengthening, pec stretches, manual prn ; iontophoresis       Patient will benefit from skilled therapeutic intervention in order to improve the following deficits and impairments:  Pain, Decreased range of motion, Decreased strength, Impaired flexibility  Visit Diagnosis: Stiffness of right shoulder, not elsewhere classified  Chronic right shoulder  pain     Problem List Patient Active Problem List   Diagnosis Date Noted  . Anxiety 04/12/2018  . Pain in joint of right shoulder 11/22/2017  . History of arthroscopic procedure on shoulder 11/22/2017    RScot Jun PTA 04/19/2018, 1:45 PM  [place] [address]Phone: [phone]  Fax:  [phone] Name: Debra HindeMRN: [id number] of Birth: 108/29/1982

## 2018-04-21 ENCOUNTER — Encounter: Payer: Self-pay | Admitting: Physical Therapy

## 2018-04-21 ENCOUNTER — Ambulatory Visit: Payer: BLUE CROSS/BLUE SHIELD | Admitting: Physical Therapy

## 2018-04-21 DIAGNOSIS — G8929 Other chronic pain: Secondary | ICD-10-CM

## 2018-04-21 DIAGNOSIS — M25611 Stiffness of right shoulder, not elsewhere classified: Secondary | ICD-10-CM | POA: Diagnosis not present

## 2018-04-21 DIAGNOSIS — M25511 Pain in right shoulder: Secondary | ICD-10-CM

## 2018-04-21 NOTE — Therapy (Signed)
[facility] [address] [place], [place], [address] Phone: [phone]   Fax:  [phone]  Physical Therapy Treatment  Patient Details  Name: Debra LahBettina [name] MRN: [id number] Date of Birth: 1980/12/11 Referring Provider: Beverely LowSteve Norris   Encounter Date: 04/21/2018  PT End of Session - 04/21/18 0918    Visit Number  4    Date for PT Re-Evaluation  05/04/18    PT Start Time  0845    PT Stop Time  0918    PT Time Calculation (min)  33 min    Activity Tolerance  Patient tolerated treatment well    Behavior During Therapy  [facility] for tasks assessed/performed       Past Medical History:  Diagnosis Date  . Abnormal Pap smear of cervix 01-16-14   -pap nl but Pos.HR HPV/Colpo 02-20-14--LSIL--needs repeat pap 3637yr.  Marland Kitchen. Anxiety    in therapy again 10/2017  . Hypercholesterolemia 2016  . Hypertension     Past Surgical History:  Procedure Laterality Date  . ROTATOR CUFF REPAIR Right 07/2017   Dr.Norris--[facility]--bone spur/arthritis  . TOE SURGERY Bilateral    -[place] child[place]  . TONSILLECTOMY AND ADENOIDECTOMY     -age 37    There were no vitals filed for this visit.  Subjective Assessment - 04/21/18 0849    Subjective  "I am good"    Currently in Pain?  No/denies    Pain Score  0-No pain                       [facility] Adult PT Treatment/Exercise - 04/21/18 0001      Shoulder Exercises: Supine   Protraction  Right;10 reps;Weights    Protraction Weight (lbs)  3    External Rotation  Weights;15 reps;Right    External Rotation Weight (lbs)  3    Internal Rotation  Weights;20 reps;Right    Internal Rotation Weight (lbs)  3      Shoulder Exercises: Seated   Other Seated Exercises  bent over rows, rev flys 3lb 2x10      Shoulder Exercises: Standing   Extension  Theraband;15 reps;Both x2    Theraband Level (Shoulder Extension)  Level 3 (Green)    Row  Both;Theraband;15 reps x2    Theraband  Level (Shoulder Row)  Level 3 (Green)      Shoulder Exercises: ROM/Strengthening   Nustep  UE only x 5 min L2    Other ROM/Strengthening Exercises  chest press 15lb 2x10     Other ROM/Strengthening Exercises  Rows & lats 25lb 2x10 each      Modalities   Modalities  Iontophoresis      Iontophoresis   Type of Iontophoresis  Dexamethasone    Location  R shoulder    Dose  1.0 ml    Time  4 hour patch      Manual Therapy   Manual Therapy  Passive ROM;Joint mobilization    Manual therapy comments  full PROM    Joint Mobilization  Jt distraction    Passive ROM  R shoulder in all planes                  PT Long Term Goals - 04/21/18 [id number]      PT LONG TERM GOAL #1   Title  Patient to report decrease in shoulder pain by 70% or more with ADLS.  Plan - 04/21/18 0919    Clinical Impression Statement  Pt continues to do well with TE. Some pinching reported with the bent over exercises. No issues with seated OHP. Full ROM noted with MT, mild pinching sensation reported with abduction at eng range. Decrease pain reported overall at home with ADL.     Rehab Potential  Excellent    PT Frequency  2x / week    PT Duration  4 weeks    PT Treatment/Interventions  ADLs/Self Care Home Management;Cryotherapy;Iontophoresis 4mg /ml Dexamethasone;Electrical Stimulation;Moist Heat;Ultrasound;Therapeutic exercise;Therapeutic activities;Neuromuscular re-education;Patient/family education;Manual techniques;Dry needling;Taping    PT Next Visit Plan  posterior capsule stretching per order, mid/low traps and PNF strengthening, pec stretches, manual prn ; iontophoresis       Patient will benefit from skilled therapeutic intervention in order to improve the following deficits and impairments:  Pain, Decreased range of motion, Decreased strength, Impaired flexibility  Visit Diagnosis: Stiffness of right shoulder, not elsewhere classified  Chronic right shoulder pain     Problem  List Patient Active Problem List   Diagnosis Date Noted  . Anxiety 04/12/2018  . Pain in joint of right shoulder 11/22/2017  . History of arthroscopic procedure on shoulder 11/22/2017    Grayce Sessions, PTA 04/21/2018, 9:22 AM  [facility] [address] [place], [place], [address] Phone: [phone]   Fax:  [phone]  Name: Debra [name] MRN: [id number] Date of Birth: 1981/08/03

## 2018-04-24 ENCOUNTER — Ambulatory Visit: Payer: BLUE CROSS/BLUE SHIELD | Admitting: Physical Therapy

## 2018-04-24 ENCOUNTER — Encounter: Payer: Self-pay | Admitting: Physical Therapy

## 2018-04-24 DIAGNOSIS — M25511 Pain in right shoulder: Principal | ICD-10-CM

## 2018-04-24 DIAGNOSIS — M25611 Stiffness of right shoulder, not elsewhere classified: Secondary | ICD-10-CM | POA: Diagnosis not present

## 2018-04-24 DIAGNOSIS — G8929 Other chronic pain: Secondary | ICD-10-CM

## 2018-04-24 NOTE — Therapy (Signed)
[place], [place], [address] Phone: [phone]   Fax:  [phone]  Physical Therapy Treatment  Patient Details  Name: Debra [name] MRN: [id number] Date of Birth: 1980/12/29 Referring Provider: Netta Cedars   Encounter Date: 04/24/2018  PT End of Session - 04/24/18 1552    Visit Number  5    Date for PT Re-Evaluation  05/04/18    PT Start Time  1515    PT Stop Time  1550    PT Time Calculation (min)  35 min    Activity Tolerance  Patient tolerated treatment well    Behavior During Therapy  [facility] for tasks assessed/performed       Past Medical History:  Diagnosis Date  . Abnormal Pap smear of cervix 01-16-14   -pap nl but Pos.HR HPV/Colpo 02-20-14--LSIL--needs repeat pap 61yr  .Marland KitchenAnxiety    in therapy again 10/2017  . Hypercholesterolemia 2016  . Hypertension     Past Surgical History:  Procedure Laterality Date  . ROTATOR CUFF REPAIR Right 07/2017   Dr.Norris--[facility]--bone spur/arthritis  . TOE SURGERY Bilateral    [place]ONSIL[place] AND ADENOIDECTOMY     -age 37   There were no vitals filed for this visit.  Subjective Assessment - 04/24/18 1520    Subjective  "Ok overall, I think I slept on it"    Currently in Pain?  No/denies    Pain Score  0-No pain         [facility] PT Assessment - 04/24/18 0001      AROM   Overall AROM   Within functional limits for tasks performed    AROM Assessment Site  Shoulder    Right/Left Shoulder  Right                   [facility] Adult PT Treatment/Exercise - 04/24/18 0001      Shoulder Exercises: Standing   Horizontal ABduction  Theraband;10 reps;Both    Extension  Theraband;Both;20 reps    Theraband Level (Shoulder Extension)  Level 3 (Green)    Row  RRaytheonTheraband    Theraband Level (Shoulder Row)  Level 3 (Green)    Other Standing Exercises  tricept 35lb bicep 20lb 2x15     Other Standing  Exercises  Flex and abd 3lb 2x10; OHP3lb 2x10       Shoulder Exercises: ROM/Strengthening   UBE (Upper Arm Bike)  L4 3 fwd/rev    Other ROM/Strengthening Exercises  chest press 15lb 2x10     Other ROM/Strengthening Exercises  Rows & lats 25lb 2x10 each      Modalities   Modalities  Iontophoresis      Iontophoresis   Type of Iontophoresis  Dexamethasone    Location  R shoulder    Dose  1.0 ml    Time  4 hour patch                  PT Long Term Goals - 04/24/18 1553      PT LONG TERM GOAL #1   Title  Patient to report decrease in shoulder pain by 70% or more with ADLS.    Status  Partially Met      PT LONG TERM GOAL #2   Title  Improved R shoulder IR to 70 degrees to improve function    Status  Achieved  Plan - 04/24/18 1553    Clinical Impression Statement  Pt has progressed meeting some of her LTG's. She reports decreased pain overall but had some pinching in her R shoulder that she suspects came form her sleeping on it wrong. Good strength and ROM with all of today's activities.    Rehab Potential  Excellent    PT Frequency  2x / week    PT Duration  4 weeks    PT Treatment/Interventions  ADLs/Self Care Home Management;Cryotherapy;Iontophoresis 9m/ml Dexamethasone;Electrical Stimulation;Moist Heat;Ultrasound;Therapeutic exercise;Therapeutic activities;Neuromuscular re-education;Patient/family education;Manual techniques;Dry needling;Taping    PT Next Visit Plan  posterior capsule stretching per order, mid/low traps and PNF strengthening, pec stretches, manual prn ; iontophoresis       Patient will benefit from skilled therapeutic intervention in order to improve the following deficits and impairments:  Pain, Decreased range of motion, Decreased strength, Impaired flexibility  Visit Diagnosis: Chronic right shoulder pain  Stiffness of right shoulder, not elsewhere classified     Problem List Patient Active Problem List   Diagnosis Date  Noted  . Anxiety 04/12/2018  . Pain in joint of right shoulder 11/22/2017  . History of arthroscopic procedure on shoulder 11/22/2017    RScot Jun PTA 04/24/2018, 3:55 PM  [place] [address] [address]Phone: [phone]  Fax:  [phone] Name: BEboney ClaybrookMRN: [id number] of Birth: [birth year]

## 2018-04-28 ENCOUNTER — Ambulatory Visit: Payer: BLUE CROSS/BLUE SHIELD | Admitting: Physical Therapy

## 2018-05-01 ENCOUNTER — Encounter: Payer: Self-pay | Admitting: Physical Therapy

## 2018-05-01 ENCOUNTER — Ambulatory Visit: Payer: BLUE CROSS/BLUE SHIELD | Admitting: Physical Therapy

## 2018-05-01 DIAGNOSIS — G8929 Other chronic pain: Secondary | ICD-10-CM

## 2018-05-01 DIAGNOSIS — M25611 Stiffness of right shoulder, not elsewhere classified: Secondary | ICD-10-CM | POA: Diagnosis not present

## 2018-05-01 DIAGNOSIS — M25511 Pain in right shoulder: Principal | ICD-10-CM

## 2018-05-01 NOTE — Therapy (Signed)
[place] [address] [place], [place], [address] Phone: [phone]   Fax:  [phone]  Physical Therapy Treatment  Patient Details  Name: Debra [name] MRN: [id number] Date of Birth: [birth year] Referring Provider: Netta Cedars   Encounter Date: 05/01/2018  PT End of Session - 05/01/18 1341    Visit Number  6    Date for PT Re-Evaluation  05/04/18    PT Start Time  1300    PT Stop Time  1340    PT Time Calculation (min)  40 min    Activity Tolerance  Patient tolerated treatment well    Behavior During Therapy  [facility] for tasks assessed/performed       Past Medical History:  Diagnosis Date  . Abnormal Pap smear of cervix 01-16-14   -pap nl but Pos.HR HPV/Colpo 02-20-14--LSIL--needs repeat pap 51yr  .Marland KitchenAnxiety    in therapy again 10/2017  . Hypercholesterolemia 2016  . Hypertension     Past Surgical History:  Procedure Laterality Date  . ROTATOR CUFF REPAIR Right 07/2017   Dr.Norris--[facility]--bone spur/arthritis  . TOE SURGERY Bilateral    -[place] . TONSILLECTOMY AND ADENOIDECTOMY     -age 37   There were no vitals filed for this visit.  Subjective Assessment - 05/01/18 1259    Subjective  "Shoulder is all right I guess" "I think I am just going to need to learn to sleep on the other side"    Currently in Pain?  No/denies    Pain Score  0-No pain                       [facility] Adult PT Treatment/Exercise - 05/01/18 0001      Shoulder Exercises: Supine   External Rotation  Weights;15 reps;Right x2    External Rotation Weight (lbs)  3    Internal Rotation  Weights;Right;15 reps x2    Internal Rotation Weight (lbs)  3      Shoulder Exercises: Standing   Flexion  10 reps;Both;Strengthening;15 reps;Weights x2    Shoulder Flexion Weight (lbs)  4    ABduction  Weights;15 reps;Both    Shoulder ABduction Weight (lbs)  4    Other Standing Exercises  tricept 35lb bicep  20lb 2x15     Other Standing Exercises  3 level cabinet reaches 3lb x15 flex/ abd      Shoulder Exercises: ROM/Strengthening   UBE (Upper Arm Bike)  L4 3 fwd/rev    Other ROM/Strengthening Exercises  chest press 15lb 2x15     Other ROM/Strengthening Exercises  Rows & lats 25lb 2x10 each      Modalities   Modalities  Iontophoresis      Iontophoresis   Type of Iontophoresis  Dexamethasone    Location  R shoulder    Dose  1.0 ml    Time  4 hour patch      Manual Therapy   Manual Therapy  Passive ROM;Joint mobilization    Passive ROM  R shoulder in all planes                  PT Long Term Goals - 05/01/18 1341      PT LONG TERM GOAL #1   Title  Patient to report decrease in shoulder pain by 70% or more with ADLS.    Status  Partially Met      PT LONG TERM GOAL #2  Title  Improved R shoulder IR to 70 degrees to improve function    Status  Achieved            Plan - 05/01/18 1342    Clinical Impression Statement  Pt has full PROM noted during MT. Goo strength with all exercises. She does report R shoulder pain in the morning from sleeping on her shoulder. She also reports reaching up behind her back.     Rehab Potential  Excellent    PT Frequency  2x / week    PT Duration  4 weeks    PT Treatment/Interventions  ADLs/Self Care Home Management;Cryotherapy;Iontophoresis 40m/ml Dexamethasone;Electrical Stimulation;Moist Heat;Ultrasound;Therapeutic exercise;Therapeutic activities;Neuromuscular re-education;Patient/family education;Manual techniques;Dry needling;Taping    PT Next Visit Plan  posterior capsule stretching per order, mid/low traps and PNF strengthening, pec stretches, manual prn. LAST IONTO       Patient will benefit from skilled therapeutic intervention in order to improve the following deficits and impairments:  Pain, Decreased range of motion, Decreased strength, Impaired flexibility  Visit Diagnosis: Chronic right shoulder pain  Stiffness of  right shoulder, not elsewhere classified     Problem List Patient Active Problem List   Diagnosis Date Noted  . Anxiety 04/12/2018  . Pain in joint of right shoulder 11/22/2017  . History of arthroscopic procedure on shoulder 11/22/2017    RScot Jun PTA 05/01/2018, 1:44 PM  [place] [address]Phone: [phone]  Fax:  [phone] Name: Debra PlatterMRN: [id number] of Birth: 1Apr 25, 1982

## 2018-05-04 ENCOUNTER — Encounter: Payer: Self-pay | Admitting: Physical Therapy

## 2018-05-04 ENCOUNTER — Ambulatory Visit: Payer: BLUE CROSS/BLUE SHIELD | Admitting: Physical Therapy

## 2018-05-04 DIAGNOSIS — M25611 Stiffness of right shoulder, not elsewhere classified: Secondary | ICD-10-CM

## 2018-05-04 DIAGNOSIS — G8929 Other chronic pain: Secondary | ICD-10-CM

## 2018-05-04 DIAGNOSIS — M25511 Pain in right shoulder: Principal | ICD-10-CM

## 2018-05-04 NOTE — Therapy (Signed)
[facility] [address] [place], [place], [address] Phone: [phone]   Fax:  [phone]  Physical Therapy Treatment  Patient Details  Name: Debra LahBettina [name] MRN: [id number] Date of Birth: 06-Feb-1981 Referring Provider: Beverely LowSteve Norris   Encounter Date: 05/04/2018  PT End of Session - 05/04/18 1645    Visit Number  7    Date for PT Re-Evaluation  05/04/18    PT Start Time  1600    PT Stop Time  1645    PT Time Calculation (min)  45 min    Activity Tolerance  Patient tolerated treatment well    Behavior During Therapy  [facility] for tasks assessed/performed       Past Medical History:  Diagnosis Date  . Abnormal Pap smear of cervix 01-16-14   -pap nl but Pos.HR HPV/Colpo 02-20-14--LSIL--needs repeat pap 2365yr.  Marland Kitchen. Anxiety    in therapy again 10/2017  . Hypercholesterolemia 2016  . Hypertension     Past Surgical History:  Procedure Laterality Date  . ROTATOR CUFF REPAIR Right 07/2017   Dr.Norris--[facility]--bone spur/arthritis  . TOE SURGERY Bilateral    -as child[place]  . TONSILLECTOMY AND ADENOIDECTOMY     -age 37    There were no vitals filed for this visit.  Subjective Assessment - 05/04/18 1600    Subjective  "It is just when I sleep on it"    Currently in Pain?  No/denies    Pain Score  0-No pain                       [facility] Adult PT Treatment/Exercise - 05/04/18 0001      Shoulder Exercises: Supine   External Rotation  Weights;15 reps;Right x2    External Rotation Weight (lbs)  3    Internal Rotation  Weights;Right;15 reps x2    Internal Rotation Weight (lbs)  3    Other Supine Exercises  RUE chest press 7lb 2x10       Shoulder Exercises: Seated   Other Seated Exercises  OHP 7lb 2x15       Shoulder Exercises: Standing   Horizontal ABduction  Theraband;10 reps;Both    Theraband Level (Shoulder Horizontal ABduction)  Level 3 (Green)    External Rotation  Weights;Right;20 reps     External Rotation Weight (lbs)  5    Internal Rotation  Right;20 reps;Weights    Internal Rotation Weight (lbs)  5    Flexion  Both;Strengthening;15 reps;Weights    Shoulder Flexion Weight (lbs)  4    ABduction  Weights;Both;20 reps x2    Shoulder ABduction Weight (lbs)  4    Other Standing Exercises  swimmers 15lb 2x10    Other Standing Exercises  Bicep curls 8lb 2x15       Shoulder Exercises: ROM/Strengthening   UBE (Upper Arm Bike)  L4 3 fwd/rev    Other ROM/Strengthening Exercises  chest press 15lb 2x15     Other ROM/Strengthening Exercises  Rows & lats 55lb 2x10 each                  PT Long Term Goals - 05/04/18 1603      PT LONG TERM GOAL #1   Title  Patient to report decrease in shoulder pain by 70% or more with ADLS.    Status  Achieved      PT LONG TERM GOAL #2   Title  Improved R shoulder IR to 70  degrees to improve function    Status  Achieved            Plan - 05/04/18 1646    Clinical Impression Statement  Pt has good strength and ROM, He does report continues shoulder pain when she sleeps on her arm. Reports a pinching sensation with OHP and resisted abduction. Gave pt updated HEP.    Rehab Potential  Excellent    PT Frequency  2x / week    PT Duration  4 weeks    PT Treatment/Interventions  ADLs/Self Care Home Management;Cryotherapy;Iontophoresis 4mg /ml Dexamethasone;Electrical Stimulation;Moist Heat;Ultrasound;Therapeutic exercise;Therapeutic activities;Neuromuscular re-education;Patient/family education;Manual techniques;Dry needling;Taping    PT Next Visit Plan  posterior capsule stretching per order, mid/low traps and PNF strengthening, pec stretches, manual prn. LAST IONTO    PT Home Exercise Plan  Shoulder Rows, Extensions, External rotation, internal rotation, flexion, and towel internal rotation stretch       Patient will benefit from skilled therapeutic intervention in order to improve the following deficits and impairments:  Pain,  Decreased range of motion, Decreased strength, Impaired flexibility  Visit Diagnosis: Chronic right shoulder pain  Stiffness of right shoulder, not elsewhere classified     Problem List Patient Active Problem List   Diagnosis Date Noted  . Anxiety 04/12/2018  . Pain in joint of right shoulder 11/22/2017  . History of arthroscopic procedure on shoulder 11/22/2017    Grayce Sessions, PTA 05/04/2018, 4:50 PM  [facility] [place] [address] [place], [place], [address] Phone: [phone]   Fax:  [phone]  Name: Debra [name] MRN: [id number] Date of Birth: 01-29-1981

## 2018-05-12 ENCOUNTER — Ambulatory Visit: Payer: Self-pay | Admitting: Obstetrics and Gynecology

## 2018-05-14 DIAGNOSIS — O039 Complete or unspecified spontaneous abortion without complication: Secondary | ICD-10-CM

## 2018-05-14 HISTORY — DX: Complete or unspecified spontaneous abortion without complication: O03.9

## 2018-05-24 ENCOUNTER — Ambulatory Visit: Payer: BLUE CROSS/BLUE SHIELD | Admitting: Obstetrics and Gynecology

## 2018-05-24 ENCOUNTER — Encounter: Payer: Self-pay | Admitting: Obstetrics and Gynecology

## 2018-05-24 VITALS — BP 126/82 | HR 78 | Ht 62.5 in | Wt 260.0 lb

## 2018-05-24 DIAGNOSIS — Z01419 Encounter for gynecological examination (general) (routine) without abnormal findings: Secondary | ICD-10-CM | POA: Diagnosis not present

## 2018-05-24 NOTE — Progress Notes (Signed)
37 y.o. G1P0 Married Caucasian female here for annual exam.     Had a miscarriage end of June.  Seen at [facility]. Treated with medication.  Had an hGC done yesterday at their office.  It was 49 last week.  Was 1400 the week prior.   In couples and individual counseling.  Some anxiety.  On Zoloft 50 mg and doing well with this.   Menses were regular after her IUD removed in October 13, 2018.   PCP:   Dr.Wong  LMP:03/07/18        Sexually active: Yes.    The current method of family planning is condoms. Exercising: Yes.    walking Smoker:  Former  Health Maintenance: Pap:  03/26/16 Pap and HR HPV negative             01-29-15 Neg:Neg HR HPV History of abnormal Pap:  Yes, 01/16/14  TDaP:  2015 Gardasil:   no HIV: 01/16/14 Negative Hep C:  NA. Screening Labs:      reports that she has quit smoking. She has never used smokeless tobacco. She reports that she drank alcohol. She reports that she does not use drugs.  Past Medical History:  Diagnosis Date  . Abnormal Pap smear of cervix 01-16-14   -pap nl but Pos.HR HPV/Colpo 02-20-14--LSIL--needs repeat pap 69yr.  Marland Kitchen Anxiety    in therapy again 10/2017  . Hypercholesterolemia 2016  . Hypertension   . Miscarriage 05/14/2018    Past Surgical History:  Procedure Laterality Date  . ROTATOR CUFF REPAIR Right 07/2017   Dr.Norris--[facility]--bone spur/arthritis  . TOE SURGERY Bilateral    -as child--in [place]  . TONSILLECTOMY AND ADENOIDECTOMY     -age 51    Current Outpatient Medications  Medication Sig Dispense Refill  . Melatonin 10 MG TABS Take by mouth.    . Prenatal Vit-Fe Fumarate-FA (PRENATAL PO) Prenatal Vitamin    . sertraline (ZOLOFT) 50 MG tablet Take 50 mg by mouth daily.     No current facility-administered medications for this visit.     Family History  Problem Relation Age of Onset  . Thyroid disease Mother   . Osteoarthritis Mother   . Hypertension Father   . Hypertension Brother   .  Rheum arthritis Maternal Grandmother     Review of Systems  Constitutional: Negative.   HENT: Negative.   Eyes: Negative.   Respiratory: Negative.   Cardiovascular: Negative.   Gastrointestinal: Negative.   Endocrine: Negative.   Genitourinary: Negative.   Musculoskeletal: Negative.   Skin: Negative.   Allergic/Immunologic: Negative.   Neurological:       Severe anxiety  Hematological: Negative.   Psychiatric/Behavioral: Negative.     Exam:   BP 126/82   Pulse 78   Ht 5' 2.5" (1.588 m)   Wt 260 lb (117.9 kg)   LMP 03/07/2018 Comment: miscarage  Breastfeeding? Unknown   BMI 46.80 kg/m     General appearance: alert, cooperative and appears stated age Head: Normocephalic, without obvious abnormality, atraumatic Neck: no adenopathy, supple, symmetrical, trachea midline and thyroid normal to inspection and palpation Lungs: clear to auscultation bilaterally Breasts: normal appearance, no masses or tenderness, No nipple retraction or dimpling, No nipple discharge or bleeding, No axillary or supraclavicular adenopathy Heart: regular rate and rhythm Abdomen: soft, non-tender; no masses, no organomegaly Extremities: extremities normal, atraumatic, no cyanosis or edema Skin: Skin color, texture, turgor normal. No rashes or lesions Lymph nodes: Cervical, supraclavicular, and axillary nodes normal. No abnormal  inguinal nodes palpated Neurologic: Grossly normal  Pelvic: External genitalia:  no lesions              Urethra:  normal appearing urethra with no masses, tenderness or lesions              Bartholins and Skenes: normal                 Vagina: normal appearing vagina with normal color and discharge, no lesions              Cervix: no lesions.  Mucousy bloody discharge.              Pap taken: No. Bimanual Exam:  Uterus:  normal size, contour, position, consistency, mobility, non-tender              Adnexa: no mass, fullness, tenderness         Chaperone was present for  exam.  Assessment:   Well woman visit with normal exam. Recent pregnancy loss.  Anxiety.   Plan: Mammogram screening age 37.  Recommended self breast awareness. Pap and HR HPV as above. Guidelines for Calcium, Vitamin D, regular exercise program including cardiovascular and weight bearing exercise. Discussed Gardasil vaccine.  She will decline for now.  Will continue PNV.  We talked about the cause of early pregnancy loss.  She and her husband will pursue pregnancy after she has had at least 2 menstrual cycles. Routine labs with PCP.  Ok to continue Zoloft through PCP.  We reviewed the safety profile for this medication in pregnancy and discussed increased risk of postpartum hemorrhage.   Follow up annually and prn.   After visit summary provided.

## 2018-05-24 NOTE — Patient Instructions (Signed)

## 2018-07-12 DIAGNOSIS — M25511 Pain in right shoulder: Secondary | ICD-10-CM | POA: Diagnosis not present

## 2018-07-12 DIAGNOSIS — Z9889 Other specified postprocedural states: Secondary | ICD-10-CM | POA: Diagnosis not present

## 2018-08-02 ENCOUNTER — Encounter: Payer: Self-pay | Admitting: Physical Therapy

## 2018-08-02 ENCOUNTER — Ambulatory Visit: Payer: Commercial Managed Care - PPO | Attending: Orthopedic Surgery | Admitting: Physical Therapy

## 2018-08-02 DIAGNOSIS — R293 Abnormal posture: Secondary | ICD-10-CM | POA: Diagnosis present

## 2018-08-02 DIAGNOSIS — R600 Localized edema: Secondary | ICD-10-CM | POA: Diagnosis present

## 2018-08-02 DIAGNOSIS — G8929 Other chronic pain: Secondary | ICD-10-CM

## 2018-08-02 DIAGNOSIS — M25511 Pain in right shoulder: Secondary | ICD-10-CM | POA: Insufficient documentation

## 2018-08-02 DIAGNOSIS — M25611 Stiffness of right shoulder, not elsewhere classified: Secondary | ICD-10-CM

## 2018-08-02 DIAGNOSIS — R252 Cramp and spasm: Secondary | ICD-10-CM

## 2018-08-02 NOTE — Therapy (Signed)
[facility] [address] [place], [place], [address] Phone: [phone]   Fax:  [phone]  Physical Therapy Evaluation  Patient Details  Name: Debra [name] MRN: [id number] Date of Birth: [birth year] Referring Provider: Dietrich PatesS. Norris   Encounter Date: 08/02/2018  PT End of Session - 08/02/18 1744    Visit Number  1    Date for PT Re-Evaluation  10/02/18    PT Start Time  1656    PT Stop Time  1746    PT Time Calculation (min)  50 min    Activity Tolerance  Patient tolerated treatment well    Behavior During Therapy  [facility] for tasks assessed/performed       Past Medical History:  Diagnosis Date  . Abnormal Pap smear of cervix 01-16-14   -pap nl but Pos.HR HPV/Colpo 02-20-14--LSIL--needs repeat pap 4239yr.  Marland Kitchen. Anxiety    in therapy again 10/2017  . Hypercholesterolemia 2016  . Hypertension   . Miscarriage 05/14/2018    Past Surgical History:  Procedure Laterality Date  . ROTATOR CUFF REPAIR Right 07/2017   Dr.Norris--[facility]--bone spur/arthritis  . TOE SURGERY Bilateral    -as child[place]  . TONSILLECTOMY AND ADENOIDECTOMY     -age 37    There were no vitals filed for this visit.   Subjective Assessment - 08/02/18 1700    Subjective  Patient had an arthroscopic surgery in September last year, she had PT and reports that she got better, but when she stopped she reports that the pain returned about 6-8 weeks later.  REports that MRI showed inflammation of the right shoulder.    Diagnostic tests  MRI shows inflammation per pt    Patient Stated Goals  to decrease pain    Currently in Pain?  Yes    Pain Score  1     Pain Location  Shoulder    Pain Orientation  Right;Anterior    Pain Descriptors / Indicators  Aching;Sharp    Pain Type  Acute pain    Pain Radiating Towards  denies    Pain Onset  More than a month ago    Pain Frequency  Constant    Aggravating Factors   lying on the right side, lifting  out and up pain up to 8/10    Pain Relieving Factors  rest pain can be 0/10    Effect of Pain on Daily Activities  loves to workout and wants to get back to that         [facility] - 08/02/18 0001      Assessment   Medical Diagnosis  right shoulder pain    Referring Provider  S. 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      ROM / Strength   AROM / PROM / Strength  AROM      AROM   Overall AROM Comments  WFL's for all except IR was to 45 degrees with pain, all motions had a painful arc in the A/C joint      Strength   Overall Strength Comments  patient with shoulder strength WFL's all cause some pain, abduction was the most painful      Palpation   Palpation comment  very tender in the right A/C joint, she is tender in the upper traps, the pecs, the bicep and the deltoid, but there can be a sharp pain replicated on the [facility] joint      Special Tests   Other special tests  + impingment, negative empty can                Objective measurements completed on examination: See above findings.      [facility] Adult PT Treatment/Exercise - 08/02/18 0001      Modalities   Modalities  Electrical Stimulation;Vasopneumatic      Programme researcher, broadcasting/film/video Location  right shoulder     Electrical Stimulation Action  IFC    Electrical Stimulation Parameters  supine    Electrical Stimulation Goals  Pain      Vasopneumatic   Number Minutes Vasopneumatic   15 minutes     Vasopnuematic Location   Shoulder    Vasopneumatic Pressure  Medium    Vasopneumatic Temperature   35                  PT Long Term Goals - 08/02/18 1750      PT LONG TERM GOAL #1   Title  Patient to report decrease in shoulder pain by 70% or more with ADLS.    Time  8    Period  Weeks    Status  New      PT LONG TERM GOAL #2   Title  Improved R shoulder IR to 70 degrees to improve function    Time  8    Period  Weeks    Status  New      PT LONG TERM GOAL #3   Title  understand and hold proper sitting posture    Time  8    Period  Weeks    Status  New      PT LONG TERM GOAL #4   Title  understand what things that may cause the shoulder problem and avoid them when she returns to the gym    Time  8    Period  Weeks    Status  New             Plan - 08/02/18 1746    Clinical Impression Statement  Patient underwent a shoulder scope with [facility] in September 2018.  She had some PT here this past summer with good results, she reports that about 8 weeks after PT she started with the right shoulder pain again.  She has very rounded shoulder with her arms in an IR position (body builder stance), she works a Health and safety inspector job and with this posture seems to have impingmenet.  She had good results with ionto in the past.  She has significant spasms and tender ness in the shoulder mms, upper traps, biceps and deltoids, she is very point tender to the superior area of the [facility] joint.  She has good ROM only limited with IR to 40 degrees    Clinical Presentation  Stable    Clinical  Decision Making  Low    Rehab Potential  Good    PT Frequency  2x / week    PT Duration  8 weeks    PT Treatment/Interventions  ADLs/Self Care Home Management;Cryotherapy;Iontophoresis 4mg /ml Dexamethasone;Electrical Stimulation;Moist Heat;Ultrasound;Therapeutic exercise;Therapeutic activities;Neuromuscular re-education;Patient/family education;Manual techniques;Dry needling;Taping;Vasopneumatic Device    PT  Next Visit Plan  really work on ER, and scapular stabilization, anterior shoulder and chest stretches to help with posture    Consulted and Agree with Plan of Care  Patient       Patient will benefit from skilled therapeutic intervention in order to improve the following deficits and impairments:  Decreased range of motion, Impaired UE functional use, Increased muscle spasms, Pain, Impaired flexibility, Improper body mechanics, Postural dysfunction, Increased edema, Decreased strength  Visit Diagnosis: Chronic right shoulder pain - Plan: PT plan of care cert/re-cert  Stiffness of right shoulder, not elsewhere classified - Plan: PT plan of care cert/re-cert  Cramp and spasm - Plan: PT plan of care cert/re-cert  Abnormal posture - Plan: PT plan of care cert/re-cert  Localized edema - Plan: PT plan of care cert/re-cert     Problem List Patient Active Problem List   Diagnosis Date Noted  . Anxiety 04/12/2018  . Pain in joint of right shoulder 11/22/2017  . History of arthroscopic procedure on shoulder 11/22/2017    Jearld Lesch., PT 08/02/2018, 5:54 PM  [facility] [place] [facility] [address] [place], [place], [address] Phone: [phone]   Fax:  [phone]  Name: Debra [name] MRN: [id number] Date of Birth: [birth year]

## 2018-08-09 ENCOUNTER — Ambulatory Visit: Payer: Commercial Managed Care - PPO | Attending: Orthopedic Surgery | Admitting: Physical Therapy

## 2018-08-09 ENCOUNTER — Encounter: Payer: Self-pay | Admitting: Physical Therapy

## 2018-08-09 DIAGNOSIS — R600 Localized edema: Secondary | ICD-10-CM | POA: Insufficient documentation

## 2018-08-09 DIAGNOSIS — M25611 Stiffness of right shoulder, not elsewhere classified: Secondary | ICD-10-CM

## 2018-08-09 DIAGNOSIS — M25511 Pain in right shoulder: Secondary | ICD-10-CM | POA: Insufficient documentation

## 2018-08-09 DIAGNOSIS — R293 Abnormal posture: Secondary | ICD-10-CM | POA: Insufficient documentation

## 2018-08-09 DIAGNOSIS — G8929 Other chronic pain: Secondary | ICD-10-CM | POA: Diagnosis not present

## 2018-08-09 DIAGNOSIS — R252 Cramp and spasm: Secondary | ICD-10-CM | POA: Diagnosis present

## 2018-08-09 NOTE — Therapy (Signed)
[facility] [place] [facility] [address] [place], [place], [address] Phone: [phone]   Fax:  [phone]  Physical Therapy Treatment  Patient Details  Name: Debra [name] MRN: [id number] Date of Birth: 01/26/1981 Referring Provider (PT): Dietrich Pates   Encounter Date: 08/09/2018  PT End of Session - 08/09/18 1546    Visit Number  2    Date for PT Re-Evaluation  10/02/18    PT Start Time  1515    PT Stop Time  1606    PT Time Calculation (min)  51 min       Past Medical History:  Diagnosis Date  . Abnormal Pap smear of cervix 01-16-14   -pap nl but Pos.HR HPV/Colpo 02-20-14--LSIL--needs repeat pap 51yr.  Marland Kitchen Anxiety    in therapy again 10/2017  . Hypercholesterolemia 2016  . Hypertension   . Miscarriage 05/14/2018    Past Surgical History:  Procedure Laterality Date  . ROTATOR CUFF REPAIR Right 07/2017   Dr.Norris--[facility]--bone spur/arthritis  . TOE SURGERY Bilateral    -as child--in [place]  . TONSILLECTOMY AND ADENOIDECTOMY     -age 44    There were no vitals filed for this visit.  Subjective Assessment - 08/09/18 1515    Subjective  No change since evaluation    Currently in Pain?  Yes    Pain Score  3     Pain Location  Shoulder    Pain Orientation  Right                       [facility] Adult PT Treatment/Exercise - 08/09/18 0001      Exercises   Exercises  Shoulder      Shoulder Exercises: Standing   External Rotation  Both;10 reps;Theraband    Theraband Level (Shoulder External Rotation)  Level 1 (Yellow)    Flexion  15 reps;Both;Weights   x2   ABduction  AROM;15 reps;Both    Extension  Theraband;15 reps;Strengthening   x2   Row  Theraband;15 reps;Both;Strengthening   x2   Theraband Level (Shoulder Row)  Level 3 (Green)      Shoulder Exercises: ROM/Strengthening   UBE (Upper Arm Bike)  L3.5 3 min each way      Modalities   Modalities  Electrical  Stimulation;Vasopneumatic;Iontophoresis      Programme researcher, broadcasting/film/video Location  right shoulder     Electrical Stimulation Action  IFC    Electrical Stimulation Parameters  supine    Electrical Stimulation Goals  Pain      Vasopneumatic   Number Minutes Vasopneumatic   15 minutes    Vasopnuematic Location   Shoulder    Vasopneumatic Pressure  Medium    Vasopneumatic Temperature   35                  PT Long Term Goals - 08/02/18 1750      PT LONG TERM GOAL #1   Title  Patient to report decrease in shoulder pain by 70% or more with ADLS.    Time  8    Period  Weeks    Status  New      PT LONG TERM GOAL #2   Title  Improved R shoulder IR to 70 degrees to improve function    Time  8    Period  Weeks    Status  New      PT LONG TERM GOAL #3  Title  understand and hold proper sitting posture    Time  8    Period  Weeks    Status  New      PT LONG TERM GOAL #4   Title  understand what things that may cause the shoulder problem and avoid them when she returns to the gym    Time  8    Period  Weeks    Status  New            Plan - 08/09/18 1549    Clinical Impression Statement  Pt is strong with good ROM but she continues to have R shoulder pain with some movements. She was able to complete today's exercises but unable do abduction with weight, resisted external rotation caused some pain as well. Pt reports that ionto helped the most last time she was in therapy. Pt also had a positive response to vaso and e-stim.    Rehab Potential  Good    PT Treatment/Interventions  ADLs/Self Care Home Management;Cryotherapy;Iontophoresis 4mg /ml Dexamethasone;Electrical Stimulation;Moist Heat;Ultrasound;Therapeutic exercise;Therapeutic activities;Neuromuscular re-education;Patient/family education;Manual techniques;Dry needling;Taping;Vasopneumatic Device    PT Next Visit Plan  really work on ER, and scapular stabilization, anterior shoulder and chest  stretches to help with posture       Patient will benefit from skilled therapeutic intervention in order to improve the following deficits and impairments:  Decreased range of motion, Impaired UE functional use, Increased muscle spasms, Pain, Impaired flexibility, Improper body mechanics, Postural dysfunction, Increased edema, Decreased strength  Visit Diagnosis: Chronic right shoulder pain  Stiffness of right shoulder, not elsewhere classified  Cramp and spasm     Problem List Patient Active Problem List   Diagnosis Date Noted  . Anxiety 04/12/2018  . Pain in joint of right shoulder 11/22/2017  . History of arthroscopic procedure on shoulder 11/22/2017    Grayce Sessions, PTA 08/09/2018, 4:02 PM  [facility] [place] [facility] [address] [place], [place], [address] Phone: [phone]   Fax:  [phone]  Name: Debra [name] MRN: [id number] Date of Birth: 03-09-1981

## 2018-08-15 ENCOUNTER — Encounter: Payer: Self-pay | Admitting: Physical Therapy

## 2018-08-15 ENCOUNTER — Ambulatory Visit: Payer: Commercial Managed Care - PPO | Admitting: Physical Therapy

## 2018-08-15 DIAGNOSIS — R600 Localized edema: Secondary | ICD-10-CM

## 2018-08-15 DIAGNOSIS — G8929 Other chronic pain: Secondary | ICD-10-CM | POA: Diagnosis not present

## 2018-08-15 DIAGNOSIS — M25611 Stiffness of right shoulder, not elsewhere classified: Secondary | ICD-10-CM

## 2018-08-15 DIAGNOSIS — M25511 Pain in right shoulder: Secondary | ICD-10-CM | POA: Diagnosis not present

## 2018-08-15 DIAGNOSIS — R293 Abnormal posture: Secondary | ICD-10-CM

## 2018-08-15 DIAGNOSIS — R252 Cramp and spasm: Secondary | ICD-10-CM

## 2018-08-15 NOTE — Patient Instructions (Signed)

## 2018-08-15 NOTE — Therapy (Signed)
[facility] [address] [place], [place], [address] Phone: [phone]   Fax:  [phone]  Physical Therapy Treatment  Patient Details  Name: Debra [name] MRN: [id number] Date of Birth: 1981-06-23 Referring Provider (PT): Dietrich Pates   Encounter Date: 08/15/2018  PT End of Session - 08/15/18 1725    Visit Number  3    Date for PT Re-Evaluation  10/02/18    PT Start Time  1656    PT Stop Time  1740    PT Time Calculation (min)  44 min    Activity Tolerance  Patient tolerated treatment well    Behavior During Therapy  [facility] for tasks assessed/performed       Past Medical History:  Diagnosis Date  . Abnormal Pap smear of cervix 01-16-14   -pap nl but Pos.HR HPV/Colpo 02-20-14--LSIL--needs repeat pap 34yr.  Marland Kitchen Anxiety    in therapy again 10/2017  . Hypercholesterolemia 2016  . Hypertension   . Miscarriage 05/14/2018    Past Surgical History:  Procedure Laterality Date  . ROTATOR CUFF REPAIR Right 07/2017   Dr.Norris--[facility]--bone spur/arthritis  . TOE SURGERY Bilateral    -as child--in [place]  . TONSILLECTOMY AND ADENOIDECTOMY     -age 6    There were no vitals filed for this visit.  Subjective Assessment - 08/15/18 1720    Subjective  Patient reports that after the last treatment with exercises she had some increased pain in the right shoulder and numbness and tingling in the right hand    Currently in Pain?  Yes    Pain Score  6     Pain Location  Shoulder    Pain Orientation  Right;Anterior;Lateral    Pain Descriptors / Indicators  Aching    Aggravating Factors   exercises, reaching up, abduction and flexion                       [facility] Adult PT Treatment/Exercise - 08/15/18 0001      Modalities   Modalities  Ultrasound;Moist Heat;Traction      Moist Heat Therapy   Number Minutes Moist Heat  15 Minutes    Moist Heat Location  Shoulder      Electrical Stimulation   Electrical Stimulation Location  right shoulder     Electrical Stimulation Action  IFC    Electrical Stimulation Parameters  supine    Electrical Stimulation Goals  Pain      Ultrasound   Ultrasound Location  right anterior and lateral shoulder     Ultrasound Parameters  100% 1.5w/cm2    Ultrasound Goals  Pain      Traction   Type of Traction  Cervical    Min (lbs)  14    Hold Time  static    Time  15      Manual Therapy   Manual Therapy  Soft tissue mobilization    Soft tissue mobilization  right lateral shoulder       Trigger Point Dry Needling - 08/15/18 1723    Consent Given?  Yes    Education Handout Provided  Yes    Muscles Treated Upper Body  --   deltoid with pain and some light twitch response               PT Long Term Goals - 08/02/18 1750      PT LONG TERM GOAL #1   Title  Patient to report  decrease in shoulder pain by 70% or more with ADLS.    Time  8    Period  Weeks    Status  New      PT LONG TERM GOAL #2   Title  Improved R shoulder IR to 70 degrees to improve function    Time  8    Period  Weeks    Status  New      PT LONG TERM GOAL #3   Title  understand and hold proper sitting posture    Time  8    Period  Weeks    Status  New      PT LONG TERM GOAL #4   Title  understand what things that may cause the shoulder problem and avoid them when she returns to the gym    Time  8    Period  Weeks    Status  New            Plan - 08/15/18 1725    Clinical Impression Statement  Patient reports that she had increased right shoulder pain after the last treatment with the initiation of exercises, she also reports that she had some numbness in her right hand for a few days, I tested neural tension and she had slight  tightness but did not replicate her pain.  I tried [place], traction and some DN today to see if anything could help.  We will assess the results at the next visit, she sees MD next week    PT Next Visit Plan  see if any of the  new stuff that I tried helped in any way, if no change may need to refer back to MD especially if we are increasing pain    Consulted and Agree with Plan of Care  Patient       Patient will benefit from skilled therapeutic intervention in order to improve the following deficits and impairments:  Decreased range of motion, Impaired UE functional use, Increased muscle spasms, Pain, Impaired flexibility, Improper body mechanics, Postural dysfunction, Increased edema, Decreased strength  Visit Diagnosis: Chronic right shoulder pain  Stiffness of right shoulder, not elsewhere classified  Cramp and spasm  Abnormal posture  Localized edema     Problem List Patient Active Problem List   Diagnosis Date Noted  . Anxiety 04/12/2018  . Pain in joint of right shoulder 11/22/2017  . History of arthroscopic procedure on shoulder 11/22/2017    Jearld Lesch., PT 08/15/2018, 5:28 PM  [facility] [place] [facility] [address] [place], [place], [address] Phone: [phone]   Fax:  [phone]  Name: Debra [name] MRN: [id number] Date of Birth: 05-05-1981

## 2018-08-23 ENCOUNTER — Encounter: Payer: Self-pay | Admitting: Physical Therapy

## 2018-08-23 ENCOUNTER — Ambulatory Visit: Payer: Commercial Managed Care - PPO | Admitting: Physical Therapy

## 2018-08-23 DIAGNOSIS — R293 Abnormal posture: Secondary | ICD-10-CM

## 2018-08-23 DIAGNOSIS — R252 Cramp and spasm: Secondary | ICD-10-CM

## 2018-08-23 DIAGNOSIS — M25511 Pain in right shoulder: Principal | ICD-10-CM

## 2018-08-23 DIAGNOSIS — G8929 Other chronic pain: Secondary | ICD-10-CM | POA: Diagnosis not present

## 2018-08-23 DIAGNOSIS — Z9889 Other specified postprocedural states: Secondary | ICD-10-CM | POA: Diagnosis not present

## 2018-08-23 DIAGNOSIS — M25611 Stiffness of right shoulder, not elsewhere classified: Secondary | ICD-10-CM

## 2018-08-23 DIAGNOSIS — M542 Cervicalgia: Secondary | ICD-10-CM | POA: Diagnosis not present

## 2018-08-23 NOTE — Therapy (Signed)
[place] [address] [place], [place], [address] Phone: [phone]   Fax:  [phone]  Physical Therapy Treatment  Patient Details  Name: Debra [name] MRN: [id number] Date of Birth: Dec 23, 1980 Referring Provider (PT): Gilberto Better   Encounter Date: 08/23/2018  PT End of Session - 08/23/18 1724    Visit Number  4    Date for PT Re-Evaluation  10/02/18    PT Start Time  1652    PT Stop Time  1733    PT Time Calculation (min)  41 min    Activity Tolerance  Patient tolerated treatment well    Behavior During Therapy  [facility] for tasks assessed/performed       Past Medical History:  Diagnosis Date  . Abnormal Pap smear of cervix 01-16-14   -pap nl but Pos.HR HPV/Colpo 02-20-14--LSIL--needs repeat pap 69yr  .Marland KitchenAnxiety    in therapy again 10/2017  . Hypercholesterolemia 2016  . Hypertension   . Miscarriage 05/14/2018    Past Surgical History:  Procedure Laterality Date  . ROTATOR CUFF REPAIR Right 07/2017   Dr.Norris--[facility]--bone spur/arthritis  . TOE SURGERY Bilateral    [place] TONSIL[place] AND ADENOIDECTOMY     -age 37   There were no vitals filed for this visit.  Subjective Assessment - 08/23/18 1722    Subjective  Patient reports that she had some pain for a day or two after the last treatment but then much improved, saw the MD and he wants uKoreato continue but limit exercises due to the increased pain with the abduction and flexion as well as ER    Currently in Pain?  Yes    Pain Score  4     Pain Location  Shoulder    Pain Orientation  Right;Anterior;Lateral    Pain Descriptors / Indicators  Aching    Pain Relieving Factors  maybe the dry needling or the traction                       [facility] Adult PT Treatment/Exercise - 08/23/18 0001      Moist Heat Therapy   Number Minutes Moist Heat  15 Minutes    Moist Heat Location  Shoulder      Electrical Stimulation    Electrical Stimulation Location  right shoulder     Electrical Stimulation Action  IFC    Electrical Stimulation Parameters  supine    Electrical Stimulation Goals  Pain      Ultrasound   Ultrasound Location  right anterior and lateral shoulder    Ultrasound Parameters  100% 1.5w/cm2    Ultrasound Goals  Pain      Traction   Type of Traction  Cervical    Min (lbs)  15    Hold Time  static    Time  15      Manual Therapy   Manual Therapy  Soft tissue mobilization    Soft tissue mobilization  right lateral shoulder, much less tenderness today than last week                  PT Long Term Goals - 08/23/18 1726      PT LONG TERM GOAL #1   Title  Patient to report decrease in shoulder pain by 70% or more with ADLS.    Status  On-going      PT LONG TERM GOAL #2  Title  Improved R shoulder IR to 70 degrees to improve function    Status  On-going      PT LONG TERM GOAL #3   Title  understand and hold proper sitting posture    Status  Partially Met      PT LONG TERM GOAL #4   Title  understand what things that may cause the shoulder problem and avoid them when she returns to the gym    Status  On-going            Plan - 08/23/18 1725    Clinical Impression Statement  Patient with some less pain after a little increase from the last treatment, the Md felt we should back off some of the exercises and try the modalities and see if this will decrease her pain    PT Next Visit Plan  may continue with the [place], traction, estim and STM     Consulted and Agree with Plan of Care  Patient       Patient will benefit from skilled therapeutic intervention in order to improve the following deficits and impairments:  Decreased range of motion, Impaired UE functional use, Increased muscle spasms, Pain, Impaired flexibility, Improper body mechanics, Postural dysfunction, Increased edema, Decreased strength  Visit Diagnosis: Chronic right shoulder pain  Stiffness of right  shoulder, not elsewhere classified  Cramp and spasm  Abnormal posture     Problem List Patient Active Problem List   Diagnosis Date Noted  . Anxiety 04/12/2018  . Pain in joint of right shoulder 11/22/2017  . History of arthroscopic procedure on shoulder 11/22/2017    Sumner Boast., PT 08/23/2018, 5:27 PM  [place], [place], [address] Phone: [phone]   Fax:  [phone]  Name: Debra [name] MRN: [id number] Date of Birth: 05/20/1981

## 2018-08-30 ENCOUNTER — Encounter: Payer: Self-pay | Admitting: Physical Therapy

## 2018-08-30 ENCOUNTER — Ambulatory Visit: Payer: Commercial Managed Care - PPO | Admitting: Physical Therapy

## 2018-08-30 DIAGNOSIS — R293 Abnormal posture: Secondary | ICD-10-CM

## 2018-08-30 DIAGNOSIS — R252 Cramp and spasm: Secondary | ICD-10-CM

## 2018-08-30 DIAGNOSIS — M25611 Stiffness of right shoulder, not elsewhere classified: Secondary | ICD-10-CM | POA: Diagnosis not present

## 2018-08-30 DIAGNOSIS — G8929 Other chronic pain: Secondary | ICD-10-CM

## 2018-08-30 DIAGNOSIS — M25511 Pain in right shoulder: Principal | ICD-10-CM

## 2018-08-30 NOTE — Therapy (Signed)
[address] [place], [place], [address] Phone: [phone]   Fax:  [phone]  Physical Therapy Treatment  Patient Details  Name: Debra [name] MRN: [id number] Date of Birth: [birth year] Referring Provider (PT): Gilberto Better   Encounter Date: 08/30/2018  PT End of Session - 08/30/18 1733    Visit Number  5    Date for PT Re-Evaluation  10/02/18    PT Start Time  1657    PT Stop Time  1750    PT Time Calculation (min)  53 min    Activity Tolerance  Patient tolerated treatment well    Behavior During Therapy  [facility] for tasks assessed/performed       Past Medical History:  Diagnosis Date  . Abnormal Pap smear of cervix 01-16-14   -pap nl but Pos.HR HPV/Colpo 02-20-14--LSIL--needs repeat pap 29yr  .Marland KitchenAnxiety    in therapy again 10/2017  . Hypercholesterolemia 2016  . Hypertension   . Miscarriage 05/14/2018    Past Surgical History:  Procedure Laterality Date  . ROTATOR CUFF REPAIR Right 07/2017   Dr.Norris--[facility]--bone spur/arthritis  . TOE SURGERY Bilateral    -[place] child[place] . TONSILLECTOMY AND ADENOIDECTOMY     -age 89   There were no vitals filed for this visit.  Subjective Assessment - 08/30/18 1727    Subjective  Patient feels like it is a little better has questions about sleeping and about returning to workout    Currently in Pain?  Yes    Pain Score  4     Pain Location  Shoulder    Pain Orientation  Right;Anterior;Lateral    Aggravating Factors   sleeping on it                       [facility] - 08/30/18 0001      Self-Care   Self-Care  Other Self-Care Comments    Other Self-Care Comments   She asked about a pillow, we looked a=ti up and went over the pros and cons and how to adjust sleep positions without pillow, she also had questions about gym, I told her no overhead or lifting into abduction and cautioned against any push ups  or bench press      Moist Heat Therapy   Number Minutes Moist Heat  15 Minutes    Moist Heat Location  Shoulder      Electrical Stimulation   Electrical Stimulation Location  right shoulder     Electrical Stimulation Action  IFC    Electrical Stimulation Parameters  supine    Electrical Stimulation Goals  Pain      Traction   Type of Traction  Cervical    Min (lbs)  15    Hold Time  static    Time  15      Manual Therapy   Manual Therapy  Soft tissue mobilization    Soft tissue mobilization  right lateral shoulder, much less tenderness today than last week       Trigger Point Dry Needling - 08/30/18 1731    Consent Given?  Yes    Muscles Treated Upper Body  --   deltoid laterally               PT Long Term Goals - 08/30/18 1735      PT LONG TERM GOAL #1   Title  Patient to report decrease in  shoulder pain by 70% or more with ADLS.    Status  On-going      PT LONG TERM GOAL #2   Title  Improved R shoulder IR to 70 degrees to improve function    Status  On-going      PT LONG TERM GOAL #3   Title  understand and hold proper sitting posture    Status  Achieved      PT LONG TERM GOAL #4   Title  understand what things that may cause the shoulder problem and avoid them when she returns to the gym    Status  Partially Met            Plan - 08/30/18 1733    Clinical Impression Statement  Patient still very tight and tender in the right lateral deltoid area.  She wants to go back to the gym, I cautioned her about this and told her to avoid any overhead or abduction lifting, and strongly suggested to not do pushups or bench press    PT Next Visit Plan  see if she has any change in motions and pain    Consulted and Agree with Plan of Care  Patient       Patient will benefit from skilled therapeutic intervention in order to improve the following deficits and impairments:  Decreased range of motion, Impaired UE functional use, Increased muscle spasms, Pain,  Impaired flexibility, Improper body mechanics, Postural dysfunction, Increased edema, Decreased strength  Visit Diagnosis: Chronic right shoulder pain  Stiffness of right shoulder, not elsewhere classified  Cramp and spasm  Abnormal posture     Problem List Patient Active Problem List   Diagnosis Date Noted  . Anxiety 04/12/2018  . Pain in joint of right shoulder 11/22/2017  . History of arthroscopic procedure on shoulder 11/22/2017    Sumner Boast., PT 08/30/2018, 5:35 PM  [address] [place], [place], [address] Phone: [phone]   Fax:  [phone]  Name: Debra [name] MRN: [id number] Date of Birth: 02/01/1981

## 2018-09-06 ENCOUNTER — Ambulatory Visit: Payer: Commercial Managed Care - PPO | Admitting: Physical Therapy

## 2018-09-26 DIAGNOSIS — Z0000 Encounter for general adult medical examination without abnormal findings: Secondary | ICD-10-CM | POA: Diagnosis not present

## 2018-09-26 DIAGNOSIS — Z131 Encounter for screening for diabetes mellitus: Secondary | ICD-10-CM | POA: Diagnosis not present

## 2018-09-26 DIAGNOSIS — E7800 Pure hypercholesterolemia, unspecified: Secondary | ICD-10-CM | POA: Diagnosis not present

## 2018-10-17 DIAGNOSIS — N912 Amenorrhea, unspecified: Secondary | ICD-10-CM | POA: Diagnosis not present

## 2018-10-19 DIAGNOSIS — N912 Amenorrhea, unspecified: Secondary | ICD-10-CM | POA: Diagnosis not present

## 2018-10-20 ENCOUNTER — Other Ambulatory Visit: Payer: Self-pay

## 2018-10-20 ENCOUNTER — Inpatient Hospital Stay (HOSPITAL_COMMUNITY): Payer: Commercial Managed Care - PPO

## 2018-10-20 ENCOUNTER — Inpatient Hospital Stay (HOSPITAL_COMMUNITY)
Admission: AD | Admit: 2018-10-20 | Discharge: 2018-10-20 | Disposition: A | Discharge status: Home / Self Care | Payer: Commercial Managed Care - PPO | Attending: Obstetrics and Gynecology | Admitting: Obstetrics and Gynecology

## 2018-10-20 ENCOUNTER — Encounter (HOSPITAL_COMMUNITY): Payer: Self-pay | Admitting: *Deleted

## 2018-10-20 DIAGNOSIS — Z87891 Personal history of nicotine dependence: Secondary | ICD-10-CM | POA: Diagnosis not present

## 2018-10-20 DIAGNOSIS — F419 Anxiety disorder, unspecified: Secondary | ICD-10-CM | POA: Diagnosis not present

## 2018-10-20 DIAGNOSIS — O26891 Other specified pregnancy related conditions, first trimester: Secondary | ICD-10-CM

## 2018-10-20 DIAGNOSIS — Z3A00 Weeks of gestation of pregnancy not specified: Secondary | ICD-10-CM | POA: Diagnosis not present

## 2018-10-20 DIAGNOSIS — N898 Other specified noninflammatory disorders of vagina: Secondary | ICD-10-CM | POA: Diagnosis present

## 2018-10-20 DIAGNOSIS — O99341 Other mental disorders complicating pregnancy, first trimester: Secondary | ICD-10-CM | POA: Insufficient documentation

## 2018-10-20 DIAGNOSIS — O3680X0 Pregnancy with inconclusive fetal viability, not applicable or unspecified: Secondary | ICD-10-CM | POA: Diagnosis not present

## 2018-10-20 DIAGNOSIS — Z79899 Other long term (current) drug therapy: Secondary | ICD-10-CM | POA: Insufficient documentation

## 2018-10-20 DIAGNOSIS — Z3A01 Less than 8 weeks gestation of pregnancy: Secondary | ICD-10-CM | POA: Insufficient documentation

## 2018-10-20 DIAGNOSIS — O26899 Other specified pregnancy related conditions, unspecified trimester: Secondary | ICD-10-CM

## 2018-10-20 DIAGNOSIS — R102 Pelvic and perineal pain: Secondary | ICD-10-CM

## 2018-10-20 DIAGNOSIS — Z8249 Family history of ischemic heart disease and other diseases of the circulatory system: Secondary | ICD-10-CM | POA: Insufficient documentation

## 2018-10-20 LAB — URINALYSIS, ROUTINE W REFLEX MICROSCOPIC
Bilirubin Urine: NEGATIVE
Glucose, UA: NEGATIVE mg/dL
Ketones, ur: NEGATIVE mg/dL
Nitrite: NEGATIVE
Protein, ur: NEGATIVE mg/dL
Specific Gravity, Urine: 1.006 (ref 1.005–1.030)
pH: 5 (ref 5.0–8.0)

## 2018-10-20 LAB — CBC WITH DIFFERENTIAL/PLATELET
Basophils Absolute: 0.1 10*3/uL (ref 0.0–0.1)
Basophils Relative: 0 %
EOS PCT: 2 %
Eosinophils Absolute: 0.2 10*3/uL (ref 0.0–0.5)
HCT: 41.1 % (ref 36.0–46.0)
Hemoglobin: 13.8 g/dL (ref 12.0–15.0)
LYMPHS PCT: 26 %
Lymphs Abs: 3.3 10*3/uL (ref 0.7–4.0)
MCH: 30.8 pg (ref 26.0–34.0)
MCHC: 33.6 g/dL (ref 30.0–36.0)
MCV: 91.7 fL (ref 80.0–100.0)
MONO ABS: 0.4 10*3/uL (ref 0.1–1.0)
Monocytes Relative: 3 %
Neutro Abs: 8.6 10*3/uL — ABNORMAL HIGH (ref 1.7–7.7)
Neutrophils Relative %: 69 %
Platelets: 378 10*3/uL (ref 150–400)
RBC: 4.48 MIL/uL (ref 3.87–5.11)
RDW: 12.2 % (ref 11.5–15.5)
WBC: 12.5 10*3/uL — ABNORMAL HIGH (ref 4.0–10.5)
nRBC: 0 % (ref 0.0–0.2)

## 2018-10-20 LAB — POCT PREGNANCY, URINE: PREG TEST UR: POSITIVE — AB

## 2018-10-20 LAB — WET PREP, GENITAL
Clue Cells Wet Prep HPF POC: NONE SEEN
Sperm: NONE SEEN
Trich, Wet Prep: NONE SEEN
YEAST WET PREP: NONE SEEN

## 2018-10-20 LAB — HCG, QUANTITATIVE, PREGNANCY: hCG, Beta Chain, Quant, S: 937 m[IU]/mL — ABNORMAL HIGH (ref <5)

## 2018-10-20 LAB — ABO/RH: ABO/RH(D): A POS

## 2018-10-20 NOTE — MAU Note (Signed)
Is 5 wks preg, today throughout the day had persistent dull pelvic pain.  Called office.   Was told if got worse to come in .  Did a home test, ? Possible bacterial infection.  Does have d/c with slight odor, no itching.

## 2018-10-20 NOTE — Discharge Instructions (Signed)
Ectopic Pregnancy An ectopic pregnancy is when the fertilized egg attaches (implants) outside the uterus. Most ectopic pregnancies occur in one of the tubes where eggs travel from the ovary to the uterus (fallopian tubes), but the implanting can occur in other locations. In rare cases, ectopic pregnancies occur on the ovary, intestine, pelvis, abdomen, or cervix. In an ectopic pregnancy, the fertilized egg does not have the ability to develop into a normal, healthy baby. A ruptured ectopic pregnancy is one in which tearing or bursting of a fallopian tube causes internal bleeding. Often, there is intense lower abdominal pain, and vaginal bleeding sometimes occurs. Having an ectopic pregnancy can be life-threatening. If this dangerous condition is not treated, it can lead to blood loss, shock, or even death. What are the causes? The most common cause of this condition is damage to one of the fallopian tubes. A fallopian tube may be narrowed or blocked, and that keeps the fertilized egg from reaching the uterus. What increases the risk? This condition is more likely to develop in women of childbearing age who have different levels of risk. The levels of risk can be divided into three categories. High risk  You have gone through infertility treatment.  You have had an ectopic pregnancy before.  You have had surgery on the fallopian tubes, or another surgical procedure, such as an abortion.  You have had surgery to have the fallopian tubes tied (tubal ligation).  You have problems or diseases of the fallopian tubes.  You have been exposed to diethylstilbestrol (DES). This medicine was used until 1971, and it had effects on babies whose mothers took the medicine.  You become pregnant while using an IUD (intrauterine device) for birth control. Moderate risk  You have a history of infertility.  You have had an STI (sexually transmitted infection).  You have a history of pelvic inflammatory  disease (PID).  You have scarring from endometriosis.  You have multiple sexual partners.  You smoke. Low risk  You have had pelvic surgery.  You use vaginal douches.  You became sexually active before age 18. What are the signs or symptoms? Common symptoms of this condition include normal pregnancy symptoms, such as missing a period, nausea, tiredness, abdominal pain, breast tenderness, and bleeding. However, ectopic pregnancy will have additional symptoms, such as:  Pain with intercourse.  Irregular vaginal bleeding or spotting.  Cramping or pain on one side or in the lower abdomen.  Fast heartbeat, low blood pressure, and sweating.  Passing out while having a bowel movement.  Symptoms of a ruptured ectopic pregnancy and internal bleeding may include:  Sudden, severe pain in the abdomen and pelvis.  Dizziness, weakness, light-headedness, or fainting.  Pain in the shoulder or neck area.  How is this diagnosed? This condition is diagnosed by:  A pelvic exam to locate pain or a mass in the abdomen.  A pregnancy test. This blood test checks for the presence as well as the specific level of pregnancy hormone in the bloodstream.  Ultrasound. This is performed if a pregnancy test is positive. In this test, a probe is inserted into the vagina. The probe will detect a fetus, possibly in a location other than the uterus.  Taking a sample of uterus tissue (dilation and curettage, or D&C).  Surgery to perform a visual exam of the inside of the abdomen using a thin, lighted tube that has a tiny camera on the end (laparoscope).  Culdocentesis. This procedure involves inserting a needle at the top   of the vagina, behind the uterus. If blood is present in this area, it may indicate that a fallopian tube is torn.  How is this treated? This condition is treated with medicine or surgery. Medicine  An injection of a medicine (methotrexate) may be given to cause the pregnancy tissue  to be absorbed. This medicine may save your fallopian tube. It may be given if: ? The diagnosis is made early, with no signs of active bleeding. ? The fallopian tube has not ruptured. ? You are considered to be a good candidate for the medicine. Usually, pregnancy hormone blood levels are checked after methotrexate treatment. This is to be sure that the medicine is effective. It may take 4-6 weeks for the pregnancy to be absorbed. Most pregnancies will be absorbed by 3 weeks. Surgery  A laparoscope may be used to remove the pregnancy tissue.  If severe internal bleeding occurs, a larger cut (incision) may be made in the lower abdomen (laparotomy) to remove the fetus and placenta. This is done to stop the bleeding.  Part or all of the fallopian tube may be removed (salpingectomy) along with the fetus and placenta. The fallopian tube may also be repaired during the surgery.  In very rare circumstances, removal of the uterus (hysterectomy) may be required.  After surgery, pregnancy hormone testing may be done to be sure that there is no pregnancy tissue left. Whether your treatment is medicine or surgery, you may receive a Rho (D) immune globulin shot to prevent problems with any future pregnancy. This shot may be given if:  You are Rh-negative and the baby's father is Rh-positive.  You are Rh-negative and you do not know the Rh type of the baby's father.  Follow these instructions at home:  Rest and limit your activity after the procedure for as long as told by your health care provider.  Until your health care provider says that it is safe: ? Do not lift anything that is heavier than 10 lb (4.5 kg), or the limit that your health care provider tells you. ? Avoid physical exercise and any movement that requires effort (is strenuous).  To help prevent constipation: ? Eat a healthy diet that includes fruits, vegetables, and whole grains. ? Drink 6-8 glasses of water per day. Get help  right away if:  You develop worsening pain that is not relieved by medicine.  You have: ? A fever or chills. ? Vaginal bleeding. ? Redness and swelling at the incision site. ? Nausea and vomiting.  You feel dizzy or weak.  You feel light-headed or you faint. This information is not intended to replace advice given to you by your health care provider. Make sure you discuss any questions you have with your health care provider. Document Released: 12/02/2004 Document Revised: 06/23/2016 Document Reviewed: 05/26/2016 Elsevier Interactive Patient Education  2018 Elsevier Inc.  

## 2018-10-20 NOTE — MAU Provider Note (Addendum)
History     CSN: [id number]  Arrival date and time: 10/20/18 1551   First Provider Initiated Contact with Patient 10/20/18 1706      Chief Complaint  Patient presents with  . Pelvic Pain  . Vaginal Discharge   Debra [name] is a 37 y.o. G2P0010 at [redacted]w[redacted]d who presents today with lower abdominal pain and vagina discharge. She was seen in the office on 12/10 and 12/12. She had HCG and progesterone levels checked. She started taking progesterone due to lowe progesterone.  HCG levels per patient: 12/10 183 12/12 478  Next visit in the office: not scheduled   Pelvic Pain  The patient's primary symptoms include pelvic pain and vaginal discharge. This is a new problem. The current episode started today. The problem occurs constantly. The problem has been unchanged. Pain severity now: 5/10. The problem affects both sides. She is pregnant. Pertinent negatives include no chills, dysuria, fever, frequency, nausea, urgency or vomiting. The vaginal discharge was yellow. There has been no bleeding. She has not been passing clots. She has not been passing tissue. The symptoms are aggravated by activity. She has tried nothing for the symptoms. Her menstrual history has been regular (LMP 09/10/18 ).    OB History    Gravida  2   Para      Term      Preterm      AB  1   Living        SAB  1   TAB      Ectopic      Multiple      Live Births              Past Medical History:  Diagnosis Date  . Abnormal Pap smear of cervix 01-16-14   -pap nl but Pos.HR HPV/Colpo 02-20-14--LSIL--needs repeat pap 21yr.  Marland Kitchen Anxiety    in therapy again 10/2017  . Hypercholesterolemia 2016  . Hypertension   . Miscarriage 05/14/2018    Past Surgical History:  Procedure Laterality Date  . ROTATOR CUFF REPAIR Right 07/2017   Dr.Norris--[facility]--bone spur/arthritis  . TOE SURGERY Bilateral    -as child--in [place]  . TONSILLECTOMY AND ADENOIDECTOMY     -age 66    Family History  Problem  Relation Age of Onset  . Thyroid disease Mother   . Osteoarthritis Mother   . Hypertension Father   . Hypertension Brother   . Rheum arthritis Maternal Grandmother     Social History   Tobacco Use  . Smoking status: Former Games developer  . Smokeless tobacco: Never Used  Substance Use Topics  . Alcohol use: Not Currently  . Drug use: No    Allergies: No Known Allergies  Medications Prior to Admission  Medication Sig Dispense Refill Last Dose  . Melatonin 10 MG TABS Take by mouth.   Taking  . Prenatal Vit-Fe Fumarate-FA (PRENATAL PO) Prenatal Vitamin   Taking  . sertraline (ZOLOFT) 50 MG tablet Take 50 mg by mouth daily.   Taking    Review of Systems  Constitutional: Negative for chills and fever.  Gastrointestinal: Negative for nausea and vomiting.  Genitourinary: Positive for pelvic pain and vaginal discharge. Negative for dysuria, frequency and urgency.   Physical Exam   Blood pressure 128/82, pulse 93, temperature 98.3 F (36.8 C), temperature source Oral, resp. rate 18, weight 121.9 kg, last menstrual period 09/11/2018, SpO2 100 %, unknown if currently breastfeeding.  Physical Exam  Nursing note and vitals reviewed. Constitutional: She is oriented  to person, place, and time. She appears well-developed and well-nourished. No distress.  HENT:  Head: Normocephalic.  Cardiovascular: Normal rate.  Respiratory: Effort normal.  GI: Soft. There is no abdominal tenderness. There is no rebound.  Neurological: She is alert and oriented to person, place, and time.  Skin: Skin is warm and dry.  Psychiatric: She has a normal mood and affect.   Results for orders placed or performed during the hospital encounter of 10/20/18 (from the past 24 hour(s))  Urinalysis, Routine w reflex microscopic     Status: Abnormal   Collection Time: 10/20/18  4:41 PM  Result Value Ref Range   Color, Urine YELLOW YELLOW   APPearance HAZY (A) CLEAR   Specific Gravity, Urine 1.006 1.005 - 1.030   pH  5.0 5.0 - 8.0   Glucose, UA NEGATIVE NEGATIVE mg/dL   Hgb urine dipstick SMALL (A) NEGATIVE   Bilirubin Urine NEGATIVE NEGATIVE   Ketones, ur NEGATIVE NEGATIVE mg/dL   Protein, ur NEGATIVE NEGATIVE mg/dL   Nitrite NEGATIVE NEGATIVE   Leukocytes, UA SMALL (A) NEGATIVE   RBC / HPF 0-5 0 - 5 RBC/hpf   WBC, UA 0-5 0 - 5 WBC/hpf   Bacteria, UA RARE (A) NONE SEEN   Squamous Epithelial / LPF 6-10 0 - 5   Mucus PRESENT   Pregnancy, urine POC     Status: Abnormal   Collection Time: 10/20/18  4:44 PM  Result Value Ref Range   Preg Test, Ur POSITIVE (A) NEGATIVE  CBC with Differential/Platelet     Status: Abnormal   Collection Time: 10/20/18  5:06 PM  Result Value Ref Range   WBC 12.5 (H) 4.0 - 10.5 K/uL   RBC 4.48 3.87 - 5.11 MIL/uL   Hemoglobin 13.8 12.0 - 15.0 g/dL   HCT 40.941.1 81.136.0 - 91.446.0 %   MCV 91.7 80.0 - 100.0 fL   MCH 30.8 26.0 - 34.0 pg   MCHC 33.6 30.0 - 36.0 g/dL   RDW 78.212.2 95.611.5 - 21.315.5 %   Platelets 378 150 - 400 K/uL   nRBC 0.0 0.0 - 0.2 %   Neutrophils Relative % 69 %   Neutro Abs 8.6 (H) 1.7 - 7.7 K/uL   Lymphocytes Relative 26 %   Lymphs Abs 3.3 0.7 - 4.0 K/uL   Monocytes Relative 3 %   Monocytes Absolute 0.4 0.1 - 1.0 K/uL   Eosinophils Relative 2 %   Eosinophils Absolute 0.2 0.0 - 0.5 K/uL   Basophils Relative 0 %   Basophils Absolute 0.1 0.0 - 0.1 K/uL  hCG, quantitative, pregnancy     Status: Abnormal   Collection Time: 10/20/18  5:06 PM  Result Value Ref Range   hCG, Beta Chain, Quant, S 937 (H) <5 mIU/mL  ABO/Rh     Status: None (Preliminary result)   Collection Time: 10/20/18  5:06 PM  Result Value Ref Range   ABO/RH(D)      A POS Performed at [facility], [address] [place], [place] [id number]   Wet prep, genital     Status: Abnormal   Collection Time: 10/20/18  5:35 PM  Result Value Ref Range   Yeast Wet Prep HPF POC NONE SEEN NONE SEEN   Trich, Wet Prep NONE SEEN NONE SEEN   Clue Cells Wet Prep HPF POC NONE SEEN NONE SEEN   WBC, Wet Prep  HPF POC MODERATE (A) NONE SEEN   Sperm NONE SEEN    [place] Ob Less Than 14 Weeks With  Ob Transvaginal  Result Date: 10/20/2018 CLINICAL DATA:  Initial evaluation for acute pelvic pain for 1 day. EXAM: OBSTETRIC <14 WK [place] AND TRANSVAGINAL OB US TECHNIQUE: Both transabdominal and transvaginal ultrasound examinations were performed for complete evaluation of the gestation as well as the maternal uterus, adnexal regions, and pelvic cul-de-sac. Transvaginal technique was performed to assess early pregnancy. COMPARISON:  None. FINDINGS: Intrauterine gestational sac: Not visualized. Yolk sac:  Negative. Embryo:  Negative. Cardiac Activity: N/A Heart Rate: N/A  bpm Subchorionic hemorrhage:  None visualized. Maternal uterus/adnexae: Ovaries are normal in appearance bilaterally. No free fluid within the pelvis. IMPRESSION: 1. Early pregnancy with no discrete IUP or adnexal mass identified. Finding is consistent with a pregnancy of unknown anatomic location. Differential considerations include IUP to early to visualize, recent SAB, or possibly occult ectopic pregnancy. Close clinical monitoring with serial beta HCGs and close interval follow-up ultrasound recommended as clinically warranted. 2. No other acute maternal uterine or adnexal abnormality identified. Electronically Signed   By: Rise Mu M.D.   On: 10/20/2018 18:24   MAU Course  Procedures  MDM HCG levels continuing to rise based on patient reported levels from the office. No IUP on [place] today, but also no concerning features. Will have patient FU with the office Monday.   Assessment and Plan   1. Pregnancy, location unknown   2. Pelvic pain affecting pregnancy    DC home Comfort measures reviewed  1st Trimester precautions  Bleeding precautions Ectopic precautions RX: none  Return to MAU as needed Left message on office voicemail for patient to have FU on Monday   Follow-up Information    Candice Camp, MD Follow up.   Specialty:   Obstetrics and Gynecology Why:  Monday in the office  Contact information: [address] [place], [address] [place] [address] [phone]            Thressa Sheller 10/20/2018, 5:09 PM

## 2018-10-23 DIAGNOSIS — N912 Amenorrhea, unspecified: Secondary | ICD-10-CM | POA: Diagnosis not present

## 2018-10-23 LAB — GC/CHLAMYDIA PROBE AMP (~~LOC~~) NOT AT ARMC
CHLAMYDIA, DNA PROBE: NEGATIVE
NEISSERIA GONORRHEA: NEGATIVE

## 2018-10-25 DIAGNOSIS — N911 Secondary amenorrhea: Secondary | ICD-10-CM | POA: Diagnosis not present

## 2018-11-07 DIAGNOSIS — N911 Secondary amenorrhea: Secondary | ICD-10-CM | POA: Diagnosis not present

## 2018-11-14 DIAGNOSIS — Z13228 Encounter for screening for other metabolic disorders: Secondary | ICD-10-CM | POA: Diagnosis not present

## 2018-11-14 DIAGNOSIS — Z3401 Encounter for supervision of normal first pregnancy, first trimester: Secondary | ICD-10-CM | POA: Diagnosis not present

## 2018-11-14 DIAGNOSIS — Z3685 Encounter for antenatal screening for Streptococcus B: Secondary | ICD-10-CM | POA: Diagnosis not present

## 2018-11-14 LAB — OB RESULTS CONSOLE RPR: RPR: NONREACTIVE

## 2018-11-14 LAB — OB RESULTS CONSOLE GC/CHLAMYDIA
Chlamydia: NEGATIVE
Gonorrhea: NEGATIVE

## 2018-11-14 LAB — OB RESULTS CONSOLE RUBELLA ANTIBODY, IGM: Rubella: IMMUNE

## 2018-11-14 LAB — OB RESULTS CONSOLE HIV ANTIBODY (ROUTINE TESTING): HIV: NONREACTIVE

## 2018-11-14 LAB — OB RESULTS CONSOLE HEPATITIS B SURFACE ANTIGEN: Hepatitis B Surface Ag: NEGATIVE

## 2018-11-14 LAB — OB RESULTS CONSOLE ABO/RH: RH Type: POSITIVE

## 2018-11-14 LAB — OB RESULTS CONSOLE ANTIBODY SCREEN: Antibody Screen: NEGATIVE

## 2018-11-19 ENCOUNTER — Inpatient Hospital Stay (HOSPITAL_COMMUNITY)
Admission: AD | Admit: 2018-11-19 | Discharge: 2018-11-19 | Disposition: A | Discharge status: Home / Self Care | Payer: Commercial Managed Care - PPO | Attending: Obstetrics and Gynecology | Admitting: Obstetrics and Gynecology

## 2018-11-19 ENCOUNTER — Encounter (HOSPITAL_COMMUNITY): Payer: Self-pay | Admitting: *Deleted

## 2018-11-19 ENCOUNTER — Inpatient Hospital Stay (HOSPITAL_COMMUNITY): Payer: Commercial Managed Care - PPO

## 2018-11-19 DIAGNOSIS — R109 Unspecified abdominal pain: Secondary | ICD-10-CM

## 2018-11-19 DIAGNOSIS — O208 Other hemorrhage in early pregnancy: Secondary | ICD-10-CM | POA: Insufficient documentation

## 2018-11-19 DIAGNOSIS — Z3A08 8 weeks gestation of pregnancy: Secondary | ICD-10-CM

## 2018-11-19 DIAGNOSIS — R1030 Lower abdominal pain, unspecified: Secondary | ICD-10-CM | POA: Insufficient documentation

## 2018-11-19 DIAGNOSIS — Z87891 Personal history of nicotine dependence: Secondary | ICD-10-CM | POA: Insufficient documentation

## 2018-11-19 DIAGNOSIS — Z3A09 9 weeks gestation of pregnancy: Secondary | ICD-10-CM | POA: Diagnosis not present

## 2018-11-19 DIAGNOSIS — O26891 Other specified pregnancy related conditions, first trimester: Secondary | ICD-10-CM

## 2018-11-19 LAB — URINALYSIS, ROUTINE W REFLEX MICROSCOPIC
BILIRUBIN URINE: NEGATIVE
Glucose, UA: NEGATIVE mg/dL
Hgb urine dipstick: NEGATIVE
Ketones, ur: NEGATIVE mg/dL
Nitrite: NEGATIVE
PH: 7 (ref 5.0–8.0)
Protein, ur: NEGATIVE mg/dL
Specific Gravity, Urine: 1.024 (ref 1.005–1.030)

## 2018-11-19 NOTE — MAU Note (Signed)
Started having pelvic and abdominal pain since yesterday afternoon, worse when moving, constant  No bleeding, no discharge

## 2018-11-19 NOTE — MAU Provider Note (Signed)
History     CSN: [id number]  Arrival date and time: 11/19/18 1018   First Provider Initiated Contact with Patient 11/19/18 1224      Chief Complaint  Patient presents with  . Abdominal Pain   HPI Debra [name] 38 y.o. [redacted]w[redacted]d Comes to MAU today with periodic lower abdominal pain that is worsening and keeping her awake at night.  Cannot turn well in bed.  Is very afraid this might be a miscarriage as she had one previously.  Denies any vaginal bleeding.  Ate a bagel this morning and has not had any vomiting today.  Took Tylenol and it is not helping this pain.  Reports the pain is always at 3/10 and then periodically is 8/10 and it is frightening for her.  Is being seen by [facility] and her next appointment is later in January.  She has had an ultrasound in the office that showed the baby and a heartbeat.  OB History    Gravida  2   Para      Term      Preterm      AB  1   Living        SAB  1   TAB      Ectopic      Multiple      Live Births              Past Medical History:  Diagnosis Date  . Abnormal Pap smear of cervix 01-16-14   -pap nl but Pos.HR HPV/Colpo 02-20-14--LSIL--needs repeat pap 61yr.  Marland Kitchen Anxiety    in therapy again 10/2017  . Hypercholesterolemia 2016  . Hypertension   . Miscarriage 05/14/2018    Past Surgical History:  Procedure Laterality Date  . ROTATOR CUFF REPAIR Right 07/2017   Dr.Norris--[facility]--bone spur/arthritis  . TOE SURGERY Bilateral    -as child--in [place]  . TONSILLECTOMY AND ADENOIDECTOMY     -age 75    Family History  Problem Relation Age of Onset  . Thyroid disease Mother   . Osteoarthritis Mother   . Hypertension Father   . Hypertension Brother   . Rheum arthritis Maternal Grandmother     Social History   Tobacco Use  . Smoking status: Former Games developer  . Smokeless tobacco: Never Used  Substance Use Topics  . Alcohol use: Not Currently  . Drug use: No    Allergies: No Known  Allergies  Medications Prior to Admission  Medication Sig Dispense Refill Last Dose  . Prenatal Vit-Fe Fumarate-FA (PRENATAL PO) Prenatal Vitamin   11/18/2018 at Unknown time  . progesterone (PROMETRIUM) 100 MG capsule Take 100 mg by mouth daily.   11/18/2018 at Unknown time  . sertraline (ZOLOFT) 50 MG tablet Take 50 mg by mouth daily.   11/18/2018 at Unknown time  . Melatonin 10 MG TABS Take by mouth.   Unknown at Unknown time    Review of Systems  Constitutional: Negative for fever.  Gastrointestinal: Positive for abdominal pain. Negative for nausea and vomiting.  Genitourinary: Negative for dysuria, vaginal bleeding and vaginal discharge.  Musculoskeletal: Negative for back pain.   Physical Exam   Blood pressure 123/75, pulse 83, temperature 98.7 F (37.1 C), temperature source Oral, resp. rate 20, height 5\' 3"  (1.6 m), weight 122.7 kg, last menstrual period 09/11/2018, SpO2 98 %, unknown if currently breastfeeding.  Physical Exam  Nursing note and vitals reviewed. Constitutional: She is oriented to person, place, and time. She appears well-developed and well-nourished.  HENT:  Head: Normocephalic.  Eyes: EOM are normal.  Neck: Neck supple.  GI: Soft. There is abdominal tenderness. There is no rebound and no guarding.  Pain all across lower abdomen, particularly in the middle.  Musculoskeletal: Normal range of motion.  Neurological: She is alert and oriented to person, place, and time.  Skin: Skin is warm and dry.  Psychiatric: She has a normal mood and affect.    MAU Course  Procedures Results for orders placed or performed during the hospital encounter of 11/19/18 (from the past 24 hour(s))  Urinalysis, Routine w reflex microscopic     Status: Abnormal   Collection Time: 11/19/18 10:56 AM  Result Value Ref Range   Color, Urine YELLOW YELLOW   APPearance HAZY (A) CLEAR   Specific Gravity, Urine 1.024 1.005 - 1.030   pH 7.0 5.0 - 8.0   Glucose, UA NEGATIVE NEGATIVE  mg/dL   Hgb urine dipstick NEGATIVE NEGATIVE   Bilirubin Urine NEGATIVE NEGATIVE   Ketones, ur NEGATIVE NEGATIVE mg/dL   Protein, ur NEGATIVE NEGATIVE mg/dL   Nitrite NEGATIVE NEGATIVE   Leukocytes, UA MODERATE (A) NEGATIVE   RBC / HPF 0-5 0 - 5 RBC/hpf   WBC, UA 0-5 0 - 5 WBC/hpf   Bacteria, UA RARE (A) NONE SEEN   Squamous Epithelial / LPF 11-20 0 - 5   Mucus PRESENT    CLINICAL DATA:  Abdominal pain in 1st trimester pregnancy. Gestational age by LMP of 9 weeks 6 days.  EXAM: OBSTETRIC <14 WK [place] AND TRANSVAGINAL OB US  TECHNIQUE: Both transabdominal and transvaginal ultrasound examinations were performed for complete evaluation of the gestation as well as the maternal uterus, adnexal regions, and pelvic cul-de-sac. Transvaginal technique was performed to assess early pregnancy.  COMPARISON:  10/20/2018  FINDINGS: Intrauterine gestational sac: Single  Yolk sac:  Visualized.  Embryo:  Visualized.  Cardiac Activity: Visualized.  Heart Rate: 180 bpm  CRL:  20 mm   8 w   4 d                  [place] EDC: 06/27/2019  Subchorionic hemorrhage:  Small subchorionic hemorrhage is seen.  Maternal uterus/adnexae: Both ovaries are normal in appearance. No mass or abnormal free fluid identified.  IMPRESSION: Single living IUP measuring 8 weeks 4 days, with [place] EDC of 06/27/2019.  Small subchorionic hemorrhage noted.    MDM Due to the amount of pain the patient is describing, will do an ultrasound today to look at the placenta and rule out a subchorionic hemorrhage. Subchorionic hemorrhage noted and discussed with client as a possible reason for her current pain.  Client is relieved that the baby looks good on [place].  Assessment and Plan  Subchorionic hemorrhage which may be causing lower abdominal pain  Plan Keep appointments as sdcheduled in the office. Reviewed subchorionic hemorrhage with client. Encouraged her to attend water exercise class as she will need  to increase her flexiblity in this pregnancy. Return to MAU with any vaginal bleeding.      Terri L Burleson 11/19/2018, 12:39 PM

## 2018-11-19 NOTE — Discharge Instructions (Signed)
Drink at least 8 8-oz glasses of water every day. Attend water exercise classes for your best health in this pregnancy. Keep your appointments in the office. Today there is a small subchorionic hemorrhage noted on your ultrasound which we do not expect to be a problem but may cause pain in pregnancy.

## 2018-11-28 DIAGNOSIS — Z3401 Encounter for supervision of normal first pregnancy, first trimester: Secondary | ICD-10-CM | POA: Diagnosis not present

## 2018-11-28 DIAGNOSIS — Z331 Pregnant state, incidental: Secondary | ICD-10-CM | POA: Diagnosis not present

## 2018-11-28 DIAGNOSIS — Z124 Encounter for screening for malignant neoplasm of cervix: Secondary | ICD-10-CM | POA: Diagnosis not present

## 2018-12-13 DIAGNOSIS — Z3A12 12 weeks gestation of pregnancy: Secondary | ICD-10-CM | POA: Diagnosis not present

## 2018-12-13 DIAGNOSIS — O09521 Supervision of elderly multigravida, first trimester: Secondary | ICD-10-CM | POA: Diagnosis not present

## 2019-05-22 ENCOUNTER — Other Ambulatory Visit (HOSPITAL_COMMUNITY): Payer: Self-pay | Admitting: *Deleted

## 2019-05-23 ENCOUNTER — Encounter (HOSPITAL_COMMUNITY): Payer: Self-pay | Admitting: *Deleted

## 2019-05-23 ENCOUNTER — Telehealth (HOSPITAL_COMMUNITY): Payer: Self-pay | Admitting: *Deleted

## 2019-05-23 NOTE — Telephone Encounter (Signed)
Preadmission screen  

## 2019-05-24 ENCOUNTER — Telehealth (HOSPITAL_COMMUNITY): Payer: Self-pay | Admitting: *Deleted

## 2019-05-24 ENCOUNTER — Encounter (HOSPITAL_COMMUNITY): Payer: Self-pay | Admitting: *Deleted

## 2019-05-24 NOTE — Telephone Encounter (Signed)
Preadmission screen  

## 2019-05-25 ENCOUNTER — Other Ambulatory Visit: Payer: Self-pay

## 2019-05-25 ENCOUNTER — Inpatient Hospital Stay (HOSPITAL_COMMUNITY)
Admission: AD | Admit: 2019-05-25 | Discharge: 2019-05-25 | Disposition: A | Discharge status: Home / Self Care | Payer: Commercial Managed Care - PPO | Attending: Obstetrics and Gynecology | Admitting: Obstetrics and Gynecology

## 2019-05-25 ENCOUNTER — Encounter (HOSPITAL_COMMUNITY): Payer: Self-pay | Admitting: *Deleted

## 2019-05-25 DIAGNOSIS — Z3A35 35 weeks gestation of pregnancy: Secondary | ICD-10-CM | POA: Diagnosis not present

## 2019-05-25 DIAGNOSIS — Z79899 Other long term (current) drug therapy: Secondary | ICD-10-CM | POA: Insufficient documentation

## 2019-05-25 DIAGNOSIS — Z3689 Encounter for other specified antenatal screening: Secondary | ICD-10-CM | POA: Insufficient documentation

## 2019-05-25 DIAGNOSIS — Z8249 Family history of ischemic heart disease and other diseases of the circulatory system: Secondary | ICD-10-CM | POA: Diagnosis not present

## 2019-05-25 DIAGNOSIS — Z87891 Personal history of nicotine dependence: Secondary | ICD-10-CM | POA: Insufficient documentation

## 2019-05-25 DIAGNOSIS — O368130 Decreased fetal movements, third trimester, not applicable or unspecified: Secondary | ICD-10-CM

## 2019-05-25 DIAGNOSIS — O99343 Other mental disorders complicating pregnancy, third trimester: Secondary | ICD-10-CM | POA: Insufficient documentation

## 2019-05-25 DIAGNOSIS — O99213 Obesity complicating pregnancy, third trimester: Secondary | ICD-10-CM | POA: Diagnosis not present

## 2019-05-25 DIAGNOSIS — O133 Gestational [pregnancy-induced] hypertension without significant proteinuria, third trimester: Secondary | ICD-10-CM | POA: Insufficient documentation

## 2019-05-25 DIAGNOSIS — E669 Obesity, unspecified: Secondary | ICD-10-CM | POA: Diagnosis not present

## 2019-05-25 DIAGNOSIS — F419 Anxiety disorder, unspecified: Secondary | ICD-10-CM | POA: Diagnosis not present

## 2019-05-25 NOTE — MAU Provider Note (Signed)
History     CSN: [id number]  Arrival date and time: 05/25/19 1329   First Provider Initiated Contact with Patient 05/25/19 1403      Chief Complaint  Patient presents with  . Decreased Fetal Movement   G2P0010 @35 .4 wks presenting with decreased FM. Sx started today. Has felt 2-3 movements which is less than usually. Denies VB, LOF, ctx. She is eating and drinking well. Her pregnancy is complicated by AMA, obesity, CHTN, and anxiety.   OB History    Gravida  2   Para      Term      Preterm      AB  1   Living        SAB  1   TAB      Ectopic      Multiple      Live Births              Past Medical History:  Diagnosis Date  . Abnormal Pap smear of cervix 01-16-14   -pap nl but Pos.HR HPV/Colpo 02-20-14--LSIL--needs repeat pap 4yr.  Marland Kitchen Anxiety    in therapy again 10/2017  . Hypercholesterolemia 2016  . Hypertension   . Miscarriage 05/14/2018  . Pregnancy induced hypertension     Past Surgical History:  Procedure Laterality Date  . COLPOSCOPY    . ROTATOR CUFF REPAIR Right 07/2017   Dr.Norris--[facility]--bone spur/arthritis  . TOE SURGERY Bilateral    -as child--in [place]  . TONSILLECTOMY AND ADENOIDECTOMY     -age 68    Family History  Problem Relation Age of Onset  . Thyroid disease Mother   . Osteoarthritis Mother   . Hypertension Father   . Hypertension Brother   . Rheum arthritis Maternal Grandmother     Social History   Tobacco Use  . Smoking status: Former Research scientist (life sciences)  . Smokeless tobacco: Never Used  Substance Use Topics  . Alcohol use: Not Currently  . Drug use: No    Allergies: No Known Allergies  Medications Prior to Admission  Medication Sig Dispense Refill Last Dose  . labetalol (NORMODYNE) 200 MG tablet Take 200 mg by mouth 2 (two) times daily.   05/25/2019 at Unknown time  . Prenatal Vit-Fe Fumarate-FA (PRENATAL PO) Prenatal Vitamin   05/25/2019 at Unknown time  . sertraline (ZOLOFT) 50 MG tablet Take 50 mg by mouth  daily.   05/25/2019 at Unknown time  . Melatonin 10 MG TABS Take by mouth.     . progesterone (PROMETRIUM) 100 MG capsule Take 100 mg by mouth daily.       Review of Systems  Gastrointestinal: Negative for abdominal pain.  Genitourinary: Negative for vaginal bleeding.   Physical Exam   Blood pressure (!) 142/74, pulse 87, temperature 98.9 F (37.2 C), resp. rate 16, last menstrual period 09/11/2018, SpO2 96 %, unknown if currently breastfeeding.  Physical Exam  Nursing note and vitals reviewed. Constitutional: She is oriented to person, place, and time. She appears well-developed and well-nourished. No distress.  HENT:  Head: Normocephalic and atraumatic.  Neck: Normal range of motion.  Cardiovascular: Normal rate.  Respiratory: Effort normal.  GI: Soft. She exhibits no distension. There is no abdominal tenderness.  gravid  Musculoskeletal: Normal range of motion.  Neurological: She is alert and oriented to person, place, and time.  Skin: Skin is warm and dry.  Psychiatric: Her mood appears anxious.  EFM: 135 bpm, mod variability, + accels, no decels Toco: none  No results found for  this or any previous visit (from the past 24 hour(s)).  MAU Course  Procedures  MDM NST reactive, FM audible, pt reassured. Pt feeling good FM after prolonged monitoring. Stable for discharge home.   Assessment and Plan   1. [redacted] weeks gestation of pregnancy   2. NST (non-stress test) reactive   3. Decreased fetal movements in third trimester, single or unspecified fetus    [facility]  Allergies as of 05/25/2019   No Known Allergies     Medication List    STOP taking these medications   Melatonin 10 MG Tabs   progesterone 100 MG capsule Commonly known as: PROMETRIUM     TAKE these medications   labetalol 200 MG tablet Commonly known as: NORMODYNE Take 200 mg by mouth 2 (two) times daily.    PRENATAL PO Prenatal Vitamin   sertraline 50 MG tablet Commonly known as: ZOLOFT Take 50 mg by mouth daily.      Donette LarryMelanie Erikson Danzy, CNM 05/25/2019, 4:16 PM

## 2019-05-25 NOTE — MAU Note (Signed)
.   Debra [name] is a 38 y.o. at [redacted]w[redacted]d here in MAU reporting: a decrease in fetal movement. Denies any vaginal bleeding or LOF. Pt states she does take a baby aspirin and labetalol for her blood pressure   Onset of complaint: today Pain score: 4 Vitals:   05/25/19 1349  BP: (!) 155/80  Pulse: 84  Resp: 16  Temp: 98.9 F (37.2 C)  SpO2: 100%     [id number] Lab orders placed from triage:

## 2019-05-25 NOTE — Discharge Instructions (Signed)
Fetal Movement Counts Patient Name: ________________________________________________ Patient Due Date: ____________________ What is a fetal movement count?  A fetal movement count is the number of times that you feel your baby move during a certain amount of time. This may also be called a fetal kick count. A fetal movement count is recommended for every pregnant woman. You may be asked to start counting fetal movements as early as week 28 of your pregnancy. Pay attention to when your baby is most active. You may notice your baby's sleep and wake cycles. You may also notice things that make your baby move more. You should do a fetal movement count:  When your baby is normally most active.  At the same time each day. A good time to count movements is while you are resting, after having something to eat and drink. How do I count fetal movements? 1. Find a quiet, comfortable area. Sit, or lie down on your side. 2. Write down the date, the start time and stop time, and the number of movements that you felt between those two times. Take this information with you to your health care visits. 3. For 2 hours, count kicks, flutters, swishes, rolls, and jabs. You should feel at least 10 movements during 2 hours. 4. You may stop counting after you have felt 10 movements. 5. If you do not feel 10 movements in 2 hours, have something to eat and drink. Then, keep resting and counting for 1 hour. If you feel at least 4 movements during that hour, you may stop counting. Contact a health care provider if:  You feel fewer than 4 movements in 2 hours.  Your baby is not moving like he or she usually does. Date: ____________ Start time: ____________ Stop time: ____________ Movements: ____________ Date: ____________ Start time: ____________ Stop time: ____________ Movements: ____________ Date: ____________ Start time: ____________ Stop time: ____________ Movements: ____________ Date: ____________ Start time:  ____________ Stop time: ____________ Movements: ____________ Date: ____________ Start time: ____________ Stop time: ____________ Movements: ____________ Date: ____________ Start time: ____________ Stop time: ____________ Movements: ____________ Date: ____________ Start time: ____________ Stop time: ____________ Movements: ____________ Date: ____________ Start time: ____________ Stop time: ____________ Movements: ____________ Date: ____________ Start time: ____________ Stop time: ____________ Movements: ____________ This information is not intended to replace advice given to you by your health care provider. Make sure you discuss any questions you have with your health care provider. Document Released: 11/24/2006 Document Revised: 11/14/2018 Document Reviewed: 12/04/2015 Elsevier Patient Education  2020 Else[employer] Braxton Hicks Contractions Contractions of the uterus can occur throughout pregnancy, but they are not always a sign that you are in labor. You may have practice contractions called Braxton Hicks contractions. These false labor contractions are sometimes confused with true labor. What are Braxton Hicks contractions? Braxton Hicks contractions are tightening movements that occur in the muscles of the uterus before labor. Unlike true labor contractions, these contractions do not result in opening (dilation) and thinning of the cervix. Toward the end of pregnancy (32-34 weeks), Braxton Hicks contractions can happen more often and may become stronger. These contractions are sometimes difficult to tell apart from true labor because they can be very uncomfortable. You should not feel embarrassed if you go to the hospital with false labor. Sometimes, the only way to tell if you are in true labor is for your health care provider to look for changes in the cervix. The health care provider will do a physical exam and may monitor your contractions. If you   are not in true labor, the exam should show  that your cervix is not dilating and your water has not broken. If there are no other health problems associated with your pregnancy, it is completely safe for you to be sent home with false labor. You may continue to have Braxton Hicks contractions until you go into true labor. How to tell the difference between true labor and false labor True labor  Contractions last 30-70 seconds.  Contractions become very regular.  Discomfort is usually felt in the top of the uterus, and it spreads to the lower abdomen and low back.  Contractions do not go away with walking.  Contractions usually become more intense and increase in frequency.  The cervix dilates and gets thinner. False labor  Contractions are usually shorter and not as strong as true labor contractions.  Contractions are usually irregular.  Contractions are often felt in the front of the lower abdomen and in the groin.  Contractions may go away when you walk around or change positions while lying down.  Contractions get weaker and are shorter-lasting as time goes on.  The cervix usually does not dilate or become thin. Follow these instructions at home:   Take over-the-counter and prescription medicines only as told by your health care provider.  Keep up with your usual exercises and follow other instructions from your health care provider.  Eat and drink lightly if you think you are going into labor.  If Braxton Hicks contractions are making you uncomfortable: ? Change your position from lying down or resting to walking, or change from walking to resting. ? Sit and rest in a tub of warm water. ? Drink enough fluid to keep your urine pale yellow. Dehydration may cause these contractions. ? Do slow and deep breathing several times an hour.  Keep all follow-up prenatal visits as told by your health care provider. This is important. Contact a health care provider if:  You have a fever.  You have continuous pain in  your abdomen. Get help right away if:  Your contractions become stronger, more regular, and closer together.  You have fluid leaking or gushing from your vagina.  You pass blood-tinged mucus (bloody show).  You have bleeding from your vagina.  You have low back pain that you never had before.  You feel your baby's head pushing down and causing pelvic pressure.  Your baby is not moving inside you as much as it used to. Summary  Contractions that occur before labor are called Braxton Hicks contractions, false labor, or practice contractions.  Braxton Hicks contractions are usually shorter, weaker, farther apart, and less regular than true labor contractions. True labor contractions usually become progressively stronger and regular, and they become more frequent.  Manage discomfort from Braxton Hicks contractions by changing position, resting in a warm bath, drinking plenty of water, or practicing deep breathing. This information is not intended to replace advice given to you by your health care provider. Make sure you discuss any questions you have with your health care provider. Document Released: 03/10/2017 Document Revised: 10/07/2017 Document Reviewed: 03/10/2017 Elsevier Patient Education  2020 Elsevier Inc.  

## 2019-05-31 NOTE — Patient Instructions (Signed)
Amadi Yoshino  05/31/2019   Your procedure is scheduled on:  06/07/2019  Arrive at 54 at Entrance C on [address] at [facility]  and [address]. You are invited to use the FREE valet parking or use the Visitor's parking deck.  Pick up the phone at the desk and dial [phone].  Call this number if you have problems the morning of surgery: [phone]  Remember:   Do not eat food:(After Midnight) Desps de medianoche.  Do not drink clear liquids: (After Midnight) Desps de medianoche.  Take these medicines the morning of surgery with A SIP OF WATER:  Take zoloft and labetalol as prescribed   Do not wear jewelry, make-up or nail polish.  Do not wear lotions, powders, or perfumes. Do not wear deodorant.  Do not shave 48 hours prior to surgery.  Do not bring valuables to the hospital.  [facility] is not   responsible for any belongings or valuables brought to the hospital.  Contacts, dentures or bridgework may not be worn into surgery.  Leave suitcase in the car. After surgery it may be brought to your room.  For patients admitted to the hospital, checkout time is 11:00 AM the day of              discharge.      Please read over the following fact sheets that you were given:     Preparing for Surgery

## 2019-06-01 ENCOUNTER — Other Ambulatory Visit (HOSPITAL_COMMUNITY)
Admission: RE | Admit: 2019-06-01 | Discharge: 2019-06-01 | Disposition: A | Discharge status: Home / Self Care | Payer: Commercial Managed Care - PPO | Source: Ambulatory Visit | Attending: Family Medicine | Admitting: Family Medicine

## 2019-06-05 ENCOUNTER — Inpatient Hospital Stay (HOSPITAL_COMMUNITY): Payer: Commercial Managed Care - PPO

## 2019-06-05 ENCOUNTER — Other Ambulatory Visit: Payer: Self-pay

## 2019-06-05 ENCOUNTER — Other Ambulatory Visit (HOSPITAL_COMMUNITY)
Admission: RE | Admit: 2019-06-05 | Discharge: 2019-06-05 | Disposition: A | Discharge status: Home / Self Care | Payer: Commercial Managed Care - PPO | Source: Ambulatory Visit | Attending: Obstetrics and Gynecology | Admitting: Obstetrics and Gynecology

## 2019-06-05 ENCOUNTER — Encounter (HOSPITAL_COMMUNITY): Payer: Self-pay

## 2019-06-05 DIAGNOSIS — Z20828 Contact with and (suspected) exposure to other viral communicable diseases: Secondary | ICD-10-CM | POA: Insufficient documentation

## 2019-06-05 LAB — SARS CORONAVIRUS 2 (TAT 6-24 HRS): SARS Coronavirus 2: NEGATIVE

## 2019-06-05 NOTE — MAU Note (Signed)
Covid swab collected. PT tolerated well. PT asymptomatic 

## 2019-06-06 ENCOUNTER — Encounter (HOSPITAL_COMMUNITY): Payer: Self-pay | Admitting: Anesthesiology

## 2019-06-06 NOTE — H&P (Addendum)
Debra [name] is a 38 y.o. female presenting for primary cesarean section at 37+ weeks.  Breech presentation and gestational HTN without severe features.  AMA with normal NIPS and level 2 [place].  GBS neg.  Serial [place] and monitoring normal  Now on labetalol for BP control.  ALso zoloft for anxiety. OB History    Gravida  2   Para      Term      Preterm      AB  1   Living        SAB  1   TAB      Ectopic      Multiple      Live Births             Past Medical History:  Diagnosis Date  . Abnormal Pap smear of cervix 01-16-14   -pap nl but Pos.HR HPV/Colpo 02-20-14--LSIL--needs repeat pap 1yr.  Marland Kitchen Anxiety    in therapy again 10/2017  . Hypercholesterolemia 2016  . Hypertension   . Miscarriage 05/14/2018  . Pregnancy induced hypertension    Past Surgical History:  Procedure Laterality Date  . COLPOSCOPY    . ROTATOR CUFF REPAIR Right 07/2017   Dr.Norris--[facility]--bone spur/arthritis  . TOE SURGERY Bilateral    -as child--in [place]  . TONSILLECTOMY AND ADENOIDECTOMY     -age 12   Family History: family history includes Hypertension in her brother and father; Osteoarthritis in her mother; Rheum arthritis in her maternal grandmother; Thyroid disease in her mother. Social History:  reports that she has quit smoking. She has never used smokeless tobacco. She reports previous alcohol use. She reports that she does not use drugs.     Maternal Diabetes: No Genetic Screening: Normal Maternal Ultrasounds/Referrals: Normal Fetal Ultrasounds or other Referrals:  Referred to Materal Fetal Medicine  Maternal Substance Abuse:  No Significant Maternal Medications:  Meds include: Zoloft Other: labetalol Significant Maternal Lab Results:  Group B Strep negative Other Comments:  None  ROS History   Last menstrual period 09/11/2018, unknown if currently breastfeeding. Exam Physical Exam  Cx Cl th high DTRs 1/4 Prenatal labs: ABO, Rh: A/Positive/-- (01/07  0000) Antibody: Negative (01/07 0000) Rubella: Immune (01/07 0000) RPR: Nonreactive (01/07 0000)  HBsAg: Negative (01/07 0000)  HIV: Non-reactive (01/07 0000)  GBS:     Assessment/Plan: IUP at 26 3/7 Gest HTN at term without severe features.  Rec delivery Breech presentation - rec Primary C/S Risks and benefits of C/S were discussed.  All questions were answered and informed consent was obtained.  Plan to proceed with low segment transverse Cesarean Section.   Luz Lex 06/06/2019, 6:05 PM This patient has been seen and examined.   All of her questions were answered.  Labs and vital signs reviewed.  Informed consent has been obtained.  The History and Physical is current. 06/07/19 0715 DL

## 2019-06-07 ENCOUNTER — Inpatient Hospital Stay (HOSPITAL_COMMUNITY)
Admission: AD | Admit: 2019-06-07 | Discharge: 2019-06-09 | DRG: 788 | Disposition: A | Discharge status: Home / Self Care | Payer: Commercial Managed Care - PPO | Attending: Obstetrics and Gynecology | Admitting: Obstetrics and Gynecology

## 2019-06-07 ENCOUNTER — Encounter (HOSPITAL_COMMUNITY): Payer: Self-pay

## 2019-06-07 ENCOUNTER — Encounter (HOSPITAL_COMMUNITY)
Admission: AD | Disposition: A | Discharge status: Home / Self Care | Payer: Self-pay | Source: Home / Self Care | Attending: Obstetrics and Gynecology

## 2019-06-07 ENCOUNTER — Other Ambulatory Visit: Payer: Self-pay

## 2019-06-07 ENCOUNTER — Inpatient Hospital Stay (HOSPITAL_COMMUNITY): Payer: Commercial Managed Care - PPO | Admitting: Certified Registered Nurse Anesthetist

## 2019-06-07 DIAGNOSIS — O321XX0 Maternal care for breech presentation, not applicable or unspecified: Secondary | ICD-10-CM | POA: Diagnosis present

## 2019-06-07 DIAGNOSIS — F419 Anxiety disorder, unspecified: Secondary | ICD-10-CM | POA: Diagnosis present

## 2019-06-07 DIAGNOSIS — Z3A37 37 weeks gestation of pregnancy: Secondary | ICD-10-CM

## 2019-06-07 DIAGNOSIS — O99344 Other mental disorders complicating childbirth: Secondary | ICD-10-CM | POA: Diagnosis present

## 2019-06-07 DIAGNOSIS — O99214 Obesity complicating childbirth: Secondary | ICD-10-CM | POA: Diagnosis present

## 2019-06-07 DIAGNOSIS — O134 Gestational [pregnancy-induced] hypertension without significant proteinuria, complicating childbirth: Secondary | ICD-10-CM | POA: Diagnosis present

## 2019-06-07 DIAGNOSIS — Z87891 Personal history of nicotine dependence: Secondary | ICD-10-CM | POA: Diagnosis not present

## 2019-06-07 LAB — ABO/RH: ABO/RH(D): A POS

## 2019-06-07 LAB — COMPREHENSIVE METABOLIC PANEL
ALT: 16 U/L (ref 0–44)
ALT: 18 U/L (ref 0–44)
AST: 28 U/L (ref 15–41)
AST: 22 U/L (ref 15–41)
Albumin: 3 g/dL — ABNORMAL LOW (ref 3.5–5.0)
Albumin: 2.6 g/dL — ABNORMAL LOW (ref 3.5–5.0)
Alkaline Phosphatase: 133 U/L — ABNORMAL HIGH (ref 38–126)
Alkaline Phosphatase: 112 U/L (ref 38–126)
Anion gap: 11 (ref 5–15)
Anion gap: 9 (ref 5–15)
BUN: 8 mg/dL (ref 6–20)
BUN: 6 mg/dL (ref 6–20)
CO2: 20 mmol/L — ABNORMAL LOW (ref 22–32)
CO2: 23 mmol/L (ref 22–32)
Calcium: 9.1 mg/dL (ref 8.9–10.3)
Calcium: 8.5 mg/dL — ABNORMAL LOW (ref 8.9–10.3)
Chloride: 106 mmol/L (ref 98–111)
Chloride: 105 mmol/L (ref 98–111)
Creatinine, Ser: 0.63 mg/dL (ref 0.44–1.00)
Creatinine, Ser: 0.61 mg/dL (ref 0.44–1.00)
GFR calc Af Amer: >60 mL/min (ref >60)
GFR calc Af Amer: >60 mL/min (ref >60)
GFR calc non Af Amer: >60 mL/min (ref >60)
GFR calc non Af Amer: >60 mL/min (ref >60)
Glucose, Bld: 89 mg/dL (ref 70–99)
Glucose, Bld: 109 mg/dL — ABNORMAL HIGH (ref 70–99)
Potassium: 4.4 mmol/L (ref 3.5–5.1)
Potassium: 4.1 mmol/L (ref 3.5–5.1)
Sodium: 137 mmol/L (ref 135–145)
Sodium: 137 mmol/L (ref 135–145)
Total Bilirubin: 0.6 mg/dL (ref 0.3–1.2)
Total Bilirubin: <0.1 mg/dL — ABNORMAL LOW (ref 0.3–1.2)
Total Protein: 5.8 g/dL — ABNORMAL LOW (ref 6.5–8.1)
Total Protein: 5.6 g/dL — ABNORMAL LOW (ref 6.5–8.1)

## 2019-06-07 LAB — RPR: RPR Ser Ql: NONREACTIVE

## 2019-06-07 LAB — CBC
HCT: 36.3 % (ref 36.0–46.0)
Hemoglobin: 12.1 g/dL (ref 12.0–15.0)
MCH: 30.6 pg (ref 26.0–34.0)
MCHC: 33.3 g/dL (ref 30.0–36.0)
MCV: 91.7 fL (ref 80.0–100.0)
Platelets: 379 10*3/uL (ref 150–400)
RBC: 3.96 MIL/uL (ref 3.87–5.11)
RDW: 13.8 % (ref 11.5–15.5)
WBC: 16.2 10*3/uL — ABNORMAL HIGH (ref 4.0–10.5)
nRBC: 0 % (ref 0.0–0.2)

## 2019-06-07 LAB — TYPE AND SCREEN
ABO/RH(D): A POS
Antibody Screen: NEGATIVE

## 2019-06-07 SURGERY — Surgical Case
Anesthesia: Spinal

## 2019-06-07 MED ORDER — LACTATED RINGERS IV BOLUS
500.0000 mL | Freq: Once | INTRAVENOUS | Status: AC
  Administered 2019-06-07: 23:00:00 500 mL via INTRAVENOUS

## 2019-06-07 MED ORDER — FENTANYL CITRATE (PF) 100 MCG/2ML IJ SOLN: 25.0000 ug | INTRAMUSCULAR | Status: DC | PRN

## 2019-06-07 MED ORDER — SCOPOLAMINE 1 MG/3DAYS TD PT72
MEDICATED_PATCH | TRANSDERMAL | Status: AC
  Filled 2019-06-07: qty 1

## 2019-06-07 MED ORDER — SOD CITRATE-CITRIC ACID 500-334 MG/5ML PO SOLN
30.0000 mL | Freq: Once | ORAL | Status: AC
  Administered 2019-06-07: 08:00:00 30 mL via ORAL

## 2019-06-07 MED ORDER — NALBUPHINE HCL 10 MG/ML IJ SOLN: 5.0000 mg | INTRAMUSCULAR | Status: DC | PRN

## 2019-06-07 MED ORDER — FENTANYL CITRATE (PF) 100 MCG/2ML IJ SOLN
INTRAMUSCULAR | Status: AC
  Filled 2019-06-07: qty 2

## 2019-06-07 MED ORDER — NALOXONE HCL 0.4 MG/ML IJ SOLN: 0.4000 mg | INTRAMUSCULAR | Status: DC | PRN

## 2019-06-07 MED ORDER — METOCLOPRAMIDE HCL 5 MG/ML IJ SOLN
INTRAMUSCULAR | Status: DC | PRN
  Administered 2019-06-07: 10 mg via INTRAVENOUS

## 2019-06-07 MED ORDER — ONDANSETRON HCL 4 MG/2ML IJ SOLN: 4.0000 mg | Freq: Three times a day (TID) | INTRAMUSCULAR | Status: DC | PRN

## 2019-06-07 MED ORDER — DIPHENHYDRAMINE HCL 25 MG PO CAPS: 25.0000 mg | ORAL_CAPSULE | Freq: Four times a day (QID) | ORAL | Status: DC | PRN

## 2019-06-07 MED ORDER — SIMETHICONE 80 MG PO CHEW
80.0000 mg | CHEWABLE_TABLET | Freq: Three times a day (TID) | ORAL | Status: DC
  Administered 2019-06-07 – 2019-06-09 (×5): 80 mg via ORAL
  Filled 2019-06-07 (×5): qty 1

## 2019-06-07 MED ORDER — SODIUM CHLORIDE 0.9 % IR SOLN
Status: DC | PRN
  Administered 2019-06-07: 1

## 2019-06-07 MED ORDER — NALBUPHINE HCL 10 MG/ML IJ SOLN: 5.0000 mg | Freq: Once | INTRAMUSCULAR | Status: DC | PRN

## 2019-06-07 MED ORDER — DIPHENHYDRAMINE HCL 50 MG/ML IJ SOLN: 12.5000 mg | INTRAMUSCULAR | Status: DC | PRN

## 2019-06-07 MED ORDER — FENTANYL CITRATE (PF) 100 MCG/2ML IJ SOLN
INTRAMUSCULAR | Status: DC | PRN
  Administered 2019-06-07: 15 ug via INTRATHECAL

## 2019-06-07 MED ORDER — DIPHENHYDRAMINE HCL 25 MG PO CAPS: 25.0000 mg | ORAL_CAPSULE | ORAL | Status: DC | PRN

## 2019-06-07 MED ORDER — OXYTOCIN 10 UNIT/ML IJ SOLN
INTRAMUSCULAR | Status: DC | PRN
  Administered 2019-06-07: 40 [IU]

## 2019-06-07 MED ORDER — SODIUM CHLORIDE 0.9 % IV SOLN: 2.0000 g | INTRAVENOUS | Status: DC

## 2019-06-07 MED ORDER — SODIUM CHLORIDE 0.9 % IV SOLN
INTRAVENOUS | Status: DC | PRN
  Administered 2019-06-07: 2 g via INTRAVENOUS

## 2019-06-07 MED ORDER — KETOROLAC TROMETHAMINE 30 MG/ML IJ SOLN
INTRAMUSCULAR | Status: AC
  Filled 2019-06-07: qty 1

## 2019-06-07 MED ORDER — METOCLOPRAMIDE HCL 5 MG/ML IJ SOLN
INTRAMUSCULAR | Status: AC
  Filled 2019-06-07: qty 2

## 2019-06-07 MED ORDER — OXYTOCIN 40 UNITS IN NORMAL SALINE INFUSION - SIMPLE MED: 2.5000 [IU]/h | INTRAVENOUS | Status: AC

## 2019-06-07 MED ORDER — WITCH HAZEL-GLYCERIN EX PADS: 1.0000 "application " | MEDICATED_PAD | CUTANEOUS | Status: DC | PRN

## 2019-06-07 MED ORDER — SODIUM CHLORIDE 0.9% FLUSH: 3.0000 mL | INTRAVENOUS | Status: DC | PRN

## 2019-06-07 MED ORDER — TETANUS-DIPHTH-ACELL PERTUSSIS 5-2.5-18.5 LF-MCG/0.5 IM SUSP: 0.5000 mL | Freq: Once | INTRAMUSCULAR | Status: DC

## 2019-06-07 MED ORDER — SENNOSIDES-DOCUSATE SODIUM 8.6-50 MG PO TABS
2.0000 | ORAL_TABLET | ORAL | Status: DC
  Administered 2019-06-08 (×2): 2 via ORAL
  Filled 2019-06-07 (×2): qty 2

## 2019-06-07 MED ORDER — PHENYLEPHRINE 40 MCG/ML (10ML) SYRINGE FOR IV PUSH (FOR BLOOD PRESSURE SUPPORT)
PREFILLED_SYRINGE | INTRAVENOUS | Status: AC
  Filled 2019-06-07: qty 10

## 2019-06-07 MED ORDER — DEXAMETHASONE SODIUM PHOSPHATE 4 MG/ML IJ SOLN
INTRAMUSCULAR | Status: DC | PRN
  Administered 2019-06-07: 4 mg via INTRAVENOUS

## 2019-06-07 MED ORDER — MENTHOL 3 MG MT LOZG: 1.0000 | LOZENGE | OROMUCOSAL | Status: DC | PRN

## 2019-06-07 MED ORDER — COCONUT OIL OIL
1.0000 "application " | TOPICAL_OIL | Status: DC | PRN
  Administered 2019-06-09: 1 via TOPICAL

## 2019-06-07 MED ORDER — DEXAMETHASONE SODIUM PHOSPHATE 4 MG/ML IJ SOLN
INTRAMUSCULAR | Status: AC
  Filled 2019-06-07: qty 7

## 2019-06-07 MED ORDER — BUPIVACAINE IN DEXTROSE 0.75-8.25 % IT SOLN
INTRATHECAL | Status: DC | PRN
  Administered 2019-06-07: 12 mL via INTRATHECAL

## 2019-06-07 MED ORDER — OXYTOCIN 40 UNITS IN NORMAL SALINE INFUSION - SIMPLE MED
INTRAVENOUS | Status: AC
  Filled 2019-06-07: qty 1000

## 2019-06-07 MED ORDER — OXYCODONE HCL 5 MG PO TABS: 5.0000 mg | ORAL_TABLET | ORAL | Status: DC | PRN

## 2019-06-07 MED ORDER — MEPERIDINE HCL 25 MG/ML IJ SOLN: 6.2500 mg | INTRAMUSCULAR | Status: DC | PRN

## 2019-06-07 MED ORDER — PRENATAL MULTIVITAMIN CH
1.0000 | ORAL_TABLET | Freq: Every day | ORAL | Status: DC
  Administered 2019-06-08: 13:00:00 1 via ORAL
  Filled 2019-06-07: qty 1

## 2019-06-07 MED ORDER — LACTATED RINGERS IV SOLN
INTRAVENOUS | Status: DC
  Administered 2019-06-07: 05:00:00 via INTRAVENOUS

## 2019-06-07 MED ORDER — MORPHINE SULFATE (PF) 0.5 MG/ML IJ SOLN
INTRAMUSCULAR | Status: AC
  Filled 2019-06-07: qty 10

## 2019-06-07 MED ORDER — DIBUCAINE (PERIANAL) 1 % EX OINT: 1.0000 "application " | TOPICAL_OINTMENT | CUTANEOUS | Status: DC | PRN

## 2019-06-07 MED ORDER — TRAMADOL HCL 50 MG PO TABS
50.0000 mg | ORAL_TABLET | Freq: Four times a day (QID) | ORAL | Status: DC | PRN
  Administered 2019-06-08 – 2019-06-09 (×4): 50 mg via ORAL
  Filled 2019-06-07 (×4): qty 1

## 2019-06-07 MED ORDER — MORPHINE SULFATE (PF) 0.5 MG/ML IJ SOLN
INTRAMUSCULAR | Status: DC | PRN
  Administered 2019-06-07: 150 ug via INTRATHECAL

## 2019-06-07 MED ORDER — SODIUM CHLORIDE 0.9 % IV SOLN
INTRAVENOUS | Status: DC | PRN
  Administered 2019-06-07: 08:00:00 via INTRAVENOUS

## 2019-06-07 MED ORDER — ONDANSETRON HCL 4 MG/2ML IJ SOLN
INTRAMUSCULAR | Status: DC | PRN
  Administered 2019-06-07: 4 mg via INTRAVENOUS

## 2019-06-07 MED ORDER — SODIUM CHLORIDE 0.9 % IV SOLN
INTRAVENOUS | Status: DC | PRN
  Administered 2019-06-07: 08:00:00 60 ug/min via INTRAVENOUS

## 2019-06-07 MED ORDER — PHENYLEPHRINE HCL (PRESSORS) 10 MG/ML IV SOLN
INTRAVENOUS | Status: DC | PRN
  Administered 2019-06-07: 80 ug via INTRAVENOUS

## 2019-06-07 MED ORDER — NALOXONE HCL 4 MG/10ML IJ SOLN
1.0000 ug/kg/h | INTRAVENOUS | Status: DC | PRN
  Filled 2019-06-07: qty 5

## 2019-06-07 MED ORDER — KETOROLAC TROMETHAMINE 30 MG/ML IJ SOLN
30.0000 mg | Freq: Four times a day (QID) | INTRAMUSCULAR | Status: AC | PRN
  Administered 2019-06-07: 10:00:00 30 mg via INTRAMUSCULAR

## 2019-06-07 MED ORDER — PHENYLEPHRINE HCL-NACL 20-0.9 MG/250ML-% IV SOLN
INTRAVENOUS | Status: AC
  Filled 2019-06-07: qty 250

## 2019-06-07 MED ORDER — SIMETHICONE 80 MG PO CHEW: 80.0000 mg | CHEWABLE_TABLET | ORAL | Status: DC | PRN

## 2019-06-07 MED ORDER — LACTATED RINGERS IV SOLN
INTRAVENOUS | Status: DC
  Administered 2019-06-07 – 2019-06-08 (×3): via INTRAVENOUS

## 2019-06-07 MED ORDER — ONDANSETRON HCL 4 MG/2ML IJ SOLN
INTRAMUSCULAR | Status: AC
  Filled 2019-06-07: qty 2

## 2019-06-07 MED ORDER — MEASLES, MUMPS & RUBELLA VAC IJ SOLR: 0.5000 mL | Freq: Once | INTRAMUSCULAR | Status: DC

## 2019-06-07 MED ORDER — SIMETHICONE 80 MG PO CHEW
80.0000 mg | CHEWABLE_TABLET | ORAL | Status: DC
  Administered 2019-06-08 (×2): 80 mg via ORAL
  Filled 2019-06-07 (×2): qty 1

## 2019-06-07 MED ORDER — SOD CITRATE-CITRIC ACID 500-334 MG/5ML PO SOLN
ORAL | Status: AC
  Filled 2019-06-07: qty 30

## 2019-06-07 MED ORDER — SERTRALINE HCL 50 MG PO TABS
50.0000 mg | ORAL_TABLET | Freq: Every day | ORAL | Status: DC
  Administered 2019-06-07 – 2019-06-09 (×3): 50 mg via ORAL
  Filled 2019-06-07 (×3): qty 1

## 2019-06-07 MED ORDER — SODIUM CHLORIDE 0.9 % IV SOLN
INTRAVENOUS | Status: AC
  Filled 2019-06-07: qty 2

## 2019-06-07 MED ORDER — LACTATED RINGERS IV SOLN
INTRAVENOUS | Status: DC | PRN
  Administered 2019-06-07 (×3): via INTRAVENOUS

## 2019-06-07 MED ORDER — KETOROLAC TROMETHAMINE 30 MG/ML IJ SOLN
30.0000 mg | Freq: Four times a day (QID) | INTRAMUSCULAR | Status: AC | PRN
  Administered 2019-06-07 – 2019-06-08 (×3): 30 mg via INTRAVENOUS
  Filled 2019-06-07 (×3): qty 1

## 2019-06-07 MED ORDER — LABETALOL HCL 200 MG PO TABS
200.0000 mg | ORAL_TABLET | Freq: Two times a day (BID) | ORAL | Status: DC
  Administered 2019-06-07 – 2019-06-09 (×4): 200 mg via ORAL
  Filled 2019-06-07 (×4): qty 1

## 2019-06-07 MED ORDER — SCOPOLAMINE 1 MG/3DAYS TD PT72
1.0000 | MEDICATED_PATCH | Freq: Once | TRANSDERMAL | Status: DC
  Administered 2019-06-07: 08:00:00 1.5 mg via TRANSDERMAL

## 2019-06-07 MED ORDER — ZOLPIDEM TARTRATE 5 MG PO TABS: 5.0000 mg | ORAL_TABLET | Freq: Every evening | ORAL | Status: DC | PRN

## 2019-06-07 SURGICAL SUPPLY — 34 items
CHLORAPREP W/TINT 26ML (MISCELLANEOUS) ×3 IMPLANT
CLAMP CORD UMBIL (MISCELLANEOUS) IMPLANT
CLOTH BEACON ORANGE TIMEOUT ST (SAFETY) ×3 IMPLANT
DRSG OPSITE POSTOP 4X10 (GAUZE/BANDAGES/DRESSINGS) ×3 IMPLANT
ELECT REM PT RETURN 9FT ADLT (ELECTROSURGICAL) ×3
ELECTRODE REM PT RTRN 9FT ADLT (ELECTROSURGICAL) ×1 IMPLANT
EXTRACTOR VACUUM M CUP 4 TUBE (SUCTIONS) IMPLANT
EXTRACTOR VACUUM M CUP 4' TUBE (SUCTIONS)
GLOVE BIOGEL PI IND STRL 7.0 (GLOVE) ×1 IMPLANT
GLOVE BIOGEL PI INDICATOR 7.0 (GLOVE) ×2
GLOVE SURG ORTHO 8.0 STRL STRW (GLOVE) ×3 IMPLANT
GOWN STRL REUS W/TWL LRG LVL3 (GOWN DISPOSABLE) ×6 IMPLANT
HOVERMATT SINGLE USE (MISCELLANEOUS) ×2 IMPLANT
KIT ABG SYR 3ML LUER SLIP (SYRINGE) ×3 IMPLANT
NDL HYPO 25X5/8 SAFETYGLIDE (NEEDLE) ×1 IMPLANT
NEEDLE HYPO 25X5/8 SAFETYGLIDE (NEEDLE) ×3 IMPLANT
NS IRRIG 1000ML POUR BTL (IV SOLUTION) ×3 IMPLANT
PACK C SECTION WH (CUSTOM PROCEDURE TRAY) ×3 IMPLANT
PAD OB MATERNITY 4.3X12.25 (PERSONAL CARE ITEMS) ×3 IMPLANT
PENCIL SMOKE EVAC W/HOLSTER (ELECTROSURGICAL) ×3 IMPLANT
RETRACTOR WND ALEXIS 25 LRG (MISCELLANEOUS) IMPLANT
RTRCTR WOUND ALEXIS 25CM LRG (MISCELLANEOUS) ×3
SUT MNCRL 0 VIOLET CTX 36 (SUTURE) ×3 IMPLANT
SUT MON AB 4-0 PS1 27 (SUTURE) ×3 IMPLANT
SUT MONOCRYL 0 CTX 36 (SUTURE) ×8
SUT PDS AB 0 CTX 60 (SUTURE) ×2 IMPLANT
SUT PDS AB 1 CT  36 (SUTURE)
SUT PDS AB 1 CT 36 (SUTURE) IMPLANT
SUT PLAIN 2 0 XLH (SUTURE) ×2 IMPLANT
SUT VIC AB 1 CTX 36 (SUTURE)
SUT VIC AB 1 CTX36XBRD ANBCTRL (SUTURE) IMPLANT
TOWEL OR 17X24 6PK STRL BLUE (TOWEL DISPOSABLE) ×3 IMPLANT
TRAY FOLEY W/BAG SLVR 14FR LF (SET/KITS/TRAYS/PACK) ×3 IMPLANT
WATER STERILE IRR 1000ML POUR (IV SOLUTION) ×3 IMPLANT

## 2019-06-07 NOTE — Anesthesia Preprocedure Evaluation (Signed)
Anesthesia Evaluation  Patient identified by MRN, date of birth, ID band Patient awake    Reviewed: Allergy & Precautions, NPO status , Patient's Chart, lab work & pertinent test results, reviewed documented beta blocker date and time   Airway Mallampati: II  TM Distance: >3 FB Neck ROM: Full    Dental no notable dental hx. (+) Teeth Intact, Caps,    Pulmonary former smoker,    Pulmonary exam normal breath sounds clear to auscultation       Cardiovascular hypertension, Pt. on medications and Pt. on home beta blockers Normal cardiovascular exam Rhythm:Regular Rate:Normal     Neuro/Psych Anxiety negative neurological ROS     GI/Hepatic Neg liver ROS, GERD  Medicated and Controlled,  Endo/Other  Morbid obesity  Renal/GU negative Renal ROS  negative genitourinary   Musculoskeletal negative musculoskeletal ROS (+)   Abdominal (+) + obese,   Peds  Hematology negative hematology ROS (+)   Anesthesia Other Findings   Reproductive/Obstetrics (+) Pregnancy Breech presentation                             Anesthesia Physical Anesthesia Plan  ASA: III  Anesthesia Plan: Spinal   Post-op Pain Management:    Induction:   PONV Risk Score and Plan: 4 or greater and Scopolamine patch - Pre-op, Ondansetron and Treatment may vary due to age or medical condition  Airway Management Planned: Natural Airway  Additional Equipment:   Intra-op Plan:   Post-operative Plan:   Informed Consent: I have reviewed the patients History and Physical, chart, labs and discussed the procedure including the risks, benefits and alternatives for the proposed anesthesia with the patient or authorized representative who has indicated his/her understanding and acceptance.     Dental advisory given  Plan Discussed with: CRNA and Surgeon  Anesthesia Plan Comments:         Anesthesia Quick Evaluation

## 2019-06-07 NOTE — Anesthesia Procedure Notes (Signed)
Spinal  Patient location during procedure: OR End time: 06/07/2019 7:58 AM Staffing Anesthesiologist: Annye Asa, MD Performed: anesthesiologist  Preanesthetic Checklist Completed: patient identified, surgical consent, pre-op evaluation, timeout performed, IV checked, risks and benefits discussed and monitors and equipment checked Spinal Block Patient position: sitting Prep: site prepped and draped and DuraPrep Patient monitoring: blood pressure, continuous pulse ox, cardiac monitor and heart rate Approach: midline Location: L3-4 Injection technique: single-shot Needle Needle type: Pencan  Needle gauge: 24 G Needle length: 9 cm Additional Notes Pt identified in Operating room.  Monitors applied. Working IV access confirmed. Sterile prep, drape lumbar spine.  1% lido local L 3,4.  #24ga Pencan into clear CSF L 3,4.  12mg  0.75% Bupivacaine with dextrose, fentanyl, morphine injected with asp CSF beginning and end of injection.  Patient asymptomatic, VSS, no heme aspirated, tolerated well.  Jenita Seashore, MD

## 2019-06-07 NOTE — Op Note (Signed)
Cesarean Section Procedure Note  Pre-operative Diagnosis: IUP at 37 weeks, Breech, Gestational HTN on meds  Post-operative Diagnosis: same  Surgeon: Luz Lex   Assistants: Maida Sale, RNFA  Anesthesia: spinal  Procedure:  Low Segment Transverse cesarean section  Procedure Details  The patient was seen in the Holding Room. The risks, benefits, complications, treatment options, and expected outcomes were discussed with the patient.  The patient concurred with the proposed plan, giving informed consent.  The site of surgery properly noted/marked.. A Time Out was held and the above information confirmed.  After induction of anesthesia, the patient was draped and prepped in the usual sterile manner. A Pfannenstiel incision was made and carried down through the subcutaneous tissue to the fascia. Fascial incision was made and extended transversely. The fascia was separated from the underlying rectus tissue superiorly and inferiorly. The peritoneum was identified and entered. Peritoneal incision was extended longitudinally. The utero-vesical peritoneal reflection was incised transversely and the bladder flap was bluntly freed from the lower uterine segment. A low transverse uterine incision was made. Delivered from frank breech presentation was a baby with Apgar scores of 8 at one minute and 9 at five minutes. After the umbilical cord was clamped and cut cord blood was obtained for evaluation. The placenta was removed intact and appeared normal. The uterine outline, tubes and ovaries appeared normal. The uterine incision was closed with running locked sutures of 0 monocryl and imbricated with 0 monocryl. Hemostasis was observed. Lavage was carried out until clear. The peritoneum was then closed with 0 monocryl and rectus muscles plicated in the midline.  After hemostasis was assured, the fascia was then reapproximated with running sutures of 0 PDS LOOP. Irrigation was applied and after adequate  hemostasis was assured, the skin was reapproximated with subcutaneous sutures using 4-0 monocryl.  Instrument, sponge, and needle counts were correct prior the abdominal closure and at the conclusion of the case. The patient received 2 grams cefotetan preoperatively.  Findings: Viable female  Estimated Blood Loss:  582cc         Specimens: Placenta was sent to labor and delivery         Complications:  None

## 2019-06-07 NOTE — Lactation Note (Signed)
This note was copied from a baby's chart. Lactation Consultation Note  Patient Name: Debra [name] [id number] Date: 06/07/2019 Reason for consult: Initial assessment;Early term 37-38.6wks;Primapara;1st time breastfeeding   C-Section for breech  Visited with Debra [name] of ET infant at 25 hrs old.  Baby has had a couple latches, and a few attempts.   Baby's temp is borderline low and RN taking baby to warmer in nursery.  [name]'s skin feels cool, has a fan blowing on her.    Reviewed breast massage and hand expression.  Spoon and syringe fed baby 2 ml of colostrum.    Hand pump given as [name]'s nipples are semi-flat.  Breast shells to be worn once [name] puts bra on.  Areola is compressible, and there is an easy flow of colostrum.    When baby returns, will assist when baby is cueing she is ready to eat.  May benefit from a nipple shield, but on digital suck assessment, baby has a nice rhythmic suck.    Breastfeeding brochure left in room.  [name] informed on IP and OP lactation support available to her.  Encouraged to call out for assistance prn.    Interventions Interventions: Breast feeding basics reviewed;Skin to skin;Breast massage;Hand express;Hand pump  Lactation Tools Discussed/Used Tools: Pump;Shells Breast pump type: Manual Pump Review: Setup, frequency, and cleaning Initiated by:: Cipriano Mile RN IBCLC Date initiated:: 06/07/19   Consult Status Consult Status: Follow-up Date: 06/07/19 Follow-up type: In-patient    Debra [name] 06/07/2019, 3:39 PM

## 2019-06-07 NOTE — Anesthesia Postprocedure Evaluation (Signed)
Anesthesia Post Note  Patient: Medical illustrator  Procedure(s) Performed: CESAREAN SECTION (N/A )     Patient location during evaluation: PACU Anesthesia Type: Spinal Level of consciousness: awake and alert, oriented and patient cooperative Pain management: pain level controlled Vital Signs Assessment: post-procedure vital signs reviewed and stable Respiratory status: spontaneous breathing, nonlabored ventilation and respiratory function stable Cardiovascular status: blood pressure returned to baseline and stable Postop Assessment: no apparent nausea or vomiting and spinal receding Anesthetic complications: no    Last Vitals:  Vitals:   06/07/19 0945 06/07/19 1000  BP: 123/62 129/68  Pulse: 83 87  Resp: (!) 21 20  Temp:  37.2 C  SpO2: 99% 99%    Last Pain:  Vitals:   06/07/19 1000  TempSrc: Oral  PainSc: 0-No pain   Pain Goal:    LLE Motor Response: Purposeful movement (06/07/19 1000) LLE Sensation: Tingling (06/07/19 1000) RLE Motor Response: Purposeful movement (06/07/19 1000) RLE Sensation: Tingling (06/07/19 1000)     Epidural/Spinal Function Cutaneous sensation: Tingles (06/07/19 1000), Patient able to flex knees: Yes (06/07/19 1000), Patient able to lift hips off bed: No (06/07/19 1000), Back pain beyond tenderness at insertion site: No (06/07/19 1000), Progressively worsening motor and/or sensory loss: No (06/07/19 1000), Bowel and/or bladder incontinence post epidural: No (06/07/19 1000)  Donika Butner,E. Demetrion Wesby

## 2019-06-07 NOTE — Progress Notes (Signed)
MOB was referred for history of depression/anxiety. * Referral screened out by Clinical Social Worker because none of the following criteria appear to apply: ~ History of anxiety/depression during this pregnancy, or of post-partum depression following prior delivery. ~ Diagnosis of anxiety and/or depression within last 3 years OR * MOB's symptoms currently being treated with medication and/or therapy. Per MOB's H&P and [facility] Records, MOB on zoloft for anxiety.     Please contact the Clinical Social Worker if needs arise, by [facility] request, or if MOB scores greater than 9/yes to question 10 on Edinburgh Postpartum Depression Screen.     Virgie Dad Nuh Lipton, MSW, LCSW [facility] [phone]

## 2019-06-07 NOTE — Transfer of Care (Signed)
Immediate Anesthesia Transfer of Care Note  Patient: Debra [name]  Procedure(s) Performed: CESAREAN SECTION (N/A )  Patient Location: PACU  Anesthesia Type:Spinal  Level of Consciousness: awake, alert  and oriented  Airway & Oxygen Therapy: Patient Spontanous Breathing  Post-op Assessment: Report given to RN and Post -op Vital signs reviewed and stable  Post vital signs: Reviewed and stable  Last Vitals:  Vitals Value Taken Time  BP 117/91 06/07/19 0858  Temp    Pulse 97 06/07/19 0900  Resp 19 06/07/19 0900  SpO2 98 % 06/07/19 0900  Vitals shown include unvalidated device data.  Last Pain:  Vitals:   06/07/19 0532  TempSrc: Oral         Complications: No apparent anesthesia complications

## 2019-06-08 LAB — CBC
HCT: 29.1 % — ABNORMAL LOW (ref 36.0–46.0)
Hemoglobin: 9.5 g/dL — ABNORMAL LOW (ref 12.0–15.0)
MCH: 30.4 pg (ref 26.0–34.0)
MCHC: 32.6 g/dL (ref 30.0–36.0)
MCV: 93 fL (ref 80.0–100.0)
Platelets: 304 10*3/uL (ref 150–400)
RBC: 3.13 MIL/uL — ABNORMAL LOW (ref 3.87–5.11)
RDW: 13.9 % (ref 11.5–15.5)
WBC: 14.1 10*3/uL — ABNORMAL HIGH (ref 4.0–10.5)
nRBC: 0 % (ref 0.0–0.2)

## 2019-06-08 NOTE — Progress Notes (Signed)
Subjective: Postpartum Day 1: Cesarean Delivery Patient reports tolerating PO.    Objective: Vital signs in last 24 hours: Temp:  [97.6 F (36.4 C)-99.1 F (37.3 C)] 98.5 F (36.9 C) (07/31 0130) Pulse Rate:  [69-87] 75 (07/31 0519) Resp:  [16-21] 18 (07/31 0519) BP: (108-129)/(61-79) 122/62 (07/31 0519) SpO2:  [97 %-99 %] 98 % (07/31 0519) Weight:  [131.5 kg] 131.5 kg (07/30 1200)  Physical Exam:  General: alert, cooperative and appears stated age 38: appropriate Uterine Fundus: firm Incision: healing well, no significant drainage, no dehiscence, no significant erythema DVT Evaluation: No evidence of DVT seen on physical exam. Negative Homan's sign. No cords or calf tenderness. No significant calf/ankle edema.  Recent Labs    06/07/19 0519 06/08/19 0535  HGB 12.1 9.5*  HCT 36.3 29.1*    Assessment/Plan: Status post Cesarean section. Doing well postoperatively.  Had low UOP overnight that has improved w/IVF bolus- in last hour had 300cc which is reassuring. Suspect had some dehydration. Encouraged PO intake. Now appears to have normovolemic.    Debra [name] 06/08/2019, 9:44 AM

## 2019-06-08 NOTE — Progress Notes (Signed)
Pt urine output consistently low throughout the night; MD Atkins notified; MD instructed RN to keep foley catheter in place until further review by MD's in the morning; pt comfortable in bed; v/s stable; no s/s of distress at this time. Will continue to monitor pt.

## 2019-06-09 MED ORDER — DOCUSATE SODIUM 100 MG PO CAPS: 100.0000 mg | ORAL_CAPSULE | Freq: Two times a day (BID) | ORAL | 2 refills | Status: DC

## 2019-06-09 MED ORDER — OXYCODONE HCL 5 MG PO TABS: 5.0000 mg | ORAL_TABLET | ORAL | 0 refills | Status: DC | PRN

## 2019-06-09 MED ORDER — IBUPROFEN 600 MG PO TABS: 600.0000 mg | ORAL_TABLET | Freq: Four times a day (QID) | ORAL | 0 refills | Status: DC | PRN

## 2019-06-09 NOTE — Plan of Care (Signed)
  Problem: Nutritional: Goal: Ability to maintain a balanced intake and output will improve Outcome: Completed/Met   Problem: Clinical Measurements: Goal: Ability to maintain clinical measurements within normal limits will improve Outcome: Completed/Met   Problem: Skin Integrity: Goal: Risk for impaired skin integrity will decrease Outcome: Completed/Met   Problem: Education: Goal: Knowledge of General Education information will improve Description: Including pain rating scale, medication(s)/side effects and non-pharmacologic comfort measures Outcome: Completed/Met   Problem: Clinical Measurements: Goal: Ability to maintain clinical measurements within normal limits will improve Outcome: Completed/Met Goal: Will remain free from infection Outcome: Completed/Met Goal: Diagnostic test results will improve Outcome: Completed/Met   Problem: Activity: Goal: Risk for activity intolerance will decrease Outcome: Completed/Met   Problem: Nutrition: Goal: Adequate nutrition will be maintained Outcome: Completed/Met   Problem: Elimination: Goal: Will not experience complications related to bowel motility Outcome: Completed/Met   Problem: Pain Managment: Goal: General experience of comfort will improve Outcome: Completed/Met   Problem: Safety: Goal: Ability to remain free from injury will improve Outcome: Completed/Met   Problem: Education: Goal: Knowledge of condition will improve Outcome: Completed/Met Goal: Individualized Educational Video(s) Outcome: Completed/Met   Problem: Activity: Goal: Will verbalize the importance of balancing activity with adequate rest periods Outcome: Completed/Met Goal: Ability to tolerate increased activity will improve Outcome: Completed/Met   Problem: Life Cycle: Goal: Chance of risk for complications during the postpartum period will decrease Outcome: Completed/Met   Problem: Skin Integrity: Goal: Demonstration of wound healing  without infection will improve Outcome: Completed/Met   Problem: Education: Goal: Knowledge of General Education information will improve Description: Including pain rating scale, medication(s)/side effects and non-pharmacologic comfort measures Outcome: Completed/Met   Problem: Clinical Measurements: Goal: Ability to maintain clinical measurements within normal limits will improve Outcome: Completed/Met   Problem: Nutrition: Goal: Adequate nutrition will be maintained Outcome: Completed/Met   Problem: Safety: Goal: Ability to remain free from injury will improve Outcome: Completed/Met   Problem: Skin Integrity: Goal: Risk for impaired skin integrity will decrease Outcome: Completed/Met   Problem: Education: Goal: Knowledge of condition will improve Outcome: Completed/Met   Problem: Activity: Goal: Will verbalize the importance of balancing activity with adequate rest periods Outcome: Completed/Met

## 2019-06-09 NOTE — Lactation Note (Signed)
This note was copied from a baby's chart. Lactation Consultation Note  Patient Name: Debra [name] [id number] Date: 06/09/2019 Reason for consult: Follow-up assessment;Difficult latch;Primapara;1st time breastfeeding;Early term 35-38.6wks  Visited with P1 Mom of ET infant at 84 hrs old.  Baby at 5% weight loss.  Mom is offering breast (pre-pumping and using nipple shield) and then offering formula by bottle.  Mom is concerned that baby isn't able to transfer milk at some feedings, as baby falls asleep and she isn't seeing colostrum in the shield.  Offered a positioning/latch assist/assessment this am prior to possible discharge. Baby sleeping STS on Mom's chest.    DEBP set up at bedside, and Mom states she has been pumping regularly.  Mom has a Medela PIS at home.  Mom informed to take all her pump parts home with her.    Encouraged breast massage and hand expression to a spoon often.   Offered to [facility], but Mom states she plans to see the IBCLC at her [facility].   Plan- 1- Keep baby STS as much as possible 2- Offer breast with any cue.  Pre-pump using hand pump, and apply the nipple shield to assist baby with latching to breast. 3- Offer supplement per volume guidelines, increasing as tolerated.  Supplement with EBM+/formula 4- Pump both breast 15-20 mins, adding breast massage and hand expression to collect as much EBM for baby as possible. 5- Follow-up with an OP lactation appointment, call us prn.  Engorgement prevention and treatment discussed. Mom denies any questions.    Interventions Interventions: Breast feeding basics reviewed;Skin to skin;Breast massage;Hand express;DEBP;Hand pump  Lactation Tools Discussed/Used Tools: Shells;Nipple [place] Fuel;Bottle;Pump Shell Type: Inverted Breast pump type: Double-Electric Breast Pump   Consult Status Consult Status: Complete Date: 06/09/19 Follow-up type: Call as needed    Broadus John 06/09/2019, 8:25 AM

## 2019-06-09 NOTE — Discharge Summary (Signed)
Obstetric Discharge Summary Reason for Admission: cesarean section, HTN, breech Prenatal Procedures: none Intrapartum Procedures: cesarean: low cervical, transverse Postpartum Procedures: none Complications-Operative and Postpartum: none Hemoglobin  Date Value Ref Range Status  06/08/2019 9.5 (L) 12.0 - 15.0 g/dL Final    Comment:    REPEATED TO VERIFY   HCT  Date Value Ref Range Status  06/08/2019 29.1 (L) 36.0 - 46.0 % Final    Physical Exam:  General: alert, cooperative and appears stated age 38: appropriate Uterine Fundus: firm Incision: healing well, no significant drainage, no dehiscence, no significant erythema DVT Evaluation: No evidence of DVT seen on physical exam. Negative Homan's sign. No cords or calf tenderness. No significant calf/ankle edema.  Discharge Diagnoses: Term Pregnancy-delivered and HTN, breech  Discharge Information: Date: 06/09/2019 Activity: pelvic rest Diet: routine Medications: Ibuprofen, Colace and oxycodone Condition: stable Instructions: refer to practice specific booklet Discharge to: home   Newborn Data: Live born female  Birth Weight: 6 lb 13.4 oz (3100 g) APGAR: 8, 9  Newborn Delivery   Birth date/time: 06/[birth year]/2019 08:14:00 Delivery type: C-Section, Low Transverse Trial of labor: No C-section categorization: Primary      Home with mother.  Debra [name] 06/09/2019, 9:36 AM

## 2019-06-18 ENCOUNTER — Inpatient Hospital Stay (HOSPITAL_COMMUNITY): Admit: 2019-06-18 | Payer: Self-pay

## 2019-06-20 ENCOUNTER — Ambulatory Visit: Payer: BLUE CROSS/BLUE SHIELD | Admitting: Obstetrics and Gynecology

## 2019-09-17 ENCOUNTER — Encounter (HOSPITAL_COMMUNITY): Payer: Self-pay

## 2019-11-05 ENCOUNTER — Ambulatory Visit: Payer: Commercial Managed Care - PPO | Attending: Internal Medicine

## 2019-11-05 DIAGNOSIS — Z20822 Contact with and (suspected) exposure to covid-19: Secondary | ICD-10-CM

## 2019-11-07 LAB — NOVEL CORONAVIRUS, NAA: SARS-CoV-2, NAA: NOT DETECTED

## 2020-01-19 ENCOUNTER — Ambulatory Visit: Payer: Commercial Managed Care - PPO | Attending: Internal Medicine

## 2020-01-19 DIAGNOSIS — Z23 Encounter for immunization: Secondary | ICD-10-CM

## 2020-01-19 NOTE — Progress Notes (Signed)
   Covid-19 Vaccination Clinic  Name:  Debra [name]    MRN: [id number] DOB: Jul 01, 1981  01/19/2020  Debra [name] was observed post Covid-19 immunization for 15 minutes without incident. She was provided with Vaccine Information Sheet and instruction to access the V-Safe system.   Debra [name] was instructed to call 911 with any severe reactions post vaccine: Marland Kitchen Difficulty breathing  . Swelling of face and throat  . A fast heartbeat  . A bad rash all over body  . Dizziness and weakness   Immunizations Administered    Name Date Dose VIS Date Route   Pfizer COVID-19 Vaccine 01/19/2020  1:44 PM 0.3 mL 10/19/2019 Intramuscular   Manufacturer: [employer], [employer]   Lot: [id number]   NDC: [address]-0

## 2020-01-25 ENCOUNTER — Encounter: Payer: Self-pay | Admitting: Certified Nurse Midwife

## 2020-02-12 ENCOUNTER — Ambulatory Visit: Payer: Commercial Managed Care - PPO

## 2020-02-20 ENCOUNTER — Ambulatory Visit: Payer: Commercial Managed Care - PPO | Attending: Internal Medicine

## 2020-02-20 DIAGNOSIS — Z23 Encounter for immunization: Secondary | ICD-10-CM

## 2020-02-20 NOTE — Progress Notes (Signed)
   Covid-19 Vaccination Clinic  Name:  Debra [name]    MRN: [id number] DOB: 05-Jun-1981  02/20/2020  [name] was observed post Covid-19 immunization for 15 minutes without incident. She was provided with Vaccine Information Sheet and instruction to access the V-Safe system.   [name] was instructed to call 911 with any severe reactions post vaccine: Marland Kitchen Difficulty breathing  . Swelling of face and throat  . A fast heartbeat  . A bad rash all over body  . Dizziness and weakness   Immunizations Administered    Name Date Dose VIS Date Route   Pfizer COVID-19 Vaccine 02/20/2020 11:03 AM 0.3 mL 10/19/2019 Intramuscular   Manufacturer: [employer], [employer]   Lot: [id number]   NDC: [address]-9

## 2020-02-27 ENCOUNTER — Ambulatory Visit: Payer: Commercial Managed Care - PPO | Attending: Internal Medicine

## 2020-02-27 DIAGNOSIS — Z20822 Contact with and (suspected) exposure to covid-19: Secondary | ICD-10-CM

## 2020-02-28 LAB — NOVEL CORONAVIRUS, NAA: SARS-CoV-2, NAA: NOT DETECTED

## 2020-02-28 LAB — SARS-COV-2, NAA 2 DAY TAT

## 2020-05-14 ENCOUNTER — Encounter: Payer: Self-pay | Admitting: Critical Care Medicine

## 2020-05-14 ENCOUNTER — Ambulatory Visit (INDEPENDENT_AMBULATORY_CARE_PROVIDER_SITE_OTHER): Payer: Commercial Managed Care - PPO | Admitting: Critical Care Medicine

## 2020-05-14 ENCOUNTER — Other Ambulatory Visit: Payer: Self-pay

## 2020-05-14 VITALS — BP 120/88 | HR 95 | Temp 98.1°F | Ht 63.19 in | Wt 296.4 lb

## 2020-05-14 DIAGNOSIS — R053 Chronic cough: Secondary | ICD-10-CM

## 2020-05-14 DIAGNOSIS — R05 Cough: Secondary | ICD-10-CM | POA: Diagnosis not present

## 2020-05-14 DIAGNOSIS — K219 Gastro-esophageal reflux disease without esophagitis: Secondary | ICD-10-CM | POA: Diagnosis not present

## 2020-05-14 NOTE — Patient Instructions (Addendum)
Thank you for visiting Dr. Chestine Spore at [facility]. We recommend the following: Orders Placed This Encounter  Procedures  . Pulmonary function test   Orders Placed This Encounter  Procedures  . Pulmonary function test    Standing Status:   Future    Standing Expiration Date:   05/14/2021    Order Specific Question:   Where should this test be performed?    Answer:   [facility]    Order Specific Question:   Full PFT: includes the following: basic spirometry, spirometry pre & post bronchodilator, diffusion capacity (DLCO), lung volumes    Answer:   Full PFT    Stay on pantoprazole.     Return in about 2 months (around 07/15/2020).  After PFTs.   Please do your part to reduce the spread of COVID-19.   Food Choices for Gastroesophageal Reflux Disease, Adult When you have gastroesophageal reflux disease (GERD), the foods you eat and your eating habits are very important. Choosing the right foods can help ease your discomfort. Think about working with a nutrition specialist (dietitian) to help you make good choices. What are tips for following this plan?  Meals  Choose healthy foods that are low in fat, such as fruits, vegetables, whole grains, low-fat dairy products, and lean meat, fish, and poultry.  Eat small meals often instead of 3 large meals a day. Eat your meals slowly, and in a place where you are relaxed. Avoid bending over or lying down until 2-3 hours after eating.  Avoid eating meals 2-3 hours before bed.  Avoid drinking a lot of liquid with meals.  Cook foods using methods other than frying. Bake, grill, or broil food instead.  Avoid or limit: ? Chocolate. ? Peppermint or spearmint. ? Alcohol. ? Pepper. ? Black and decaffeinated coffee. ? Black and decaffeinated tea. ? Bubbly (carbonated) soft drinks. ? Caffeinated energy drinks and soft drinks.  Limit high-fat foods such as: ? Fatty meat or fried foods. ? Whole milk, cream, butter, or ice  cream. ? Nuts and nut butters. ? Pastries, donuts, and sweets made with butter or shortening.  Avoid foods that cause symptoms. These foods may be different for everyone. Common foods that cause symptoms include: ? Tomatoes. ? Oranges, lemons, and limes. ? Peppers. ? Spicy food. ? Onions and garlic. ? Vinegar. Lifestyle  Maintain a healthy weight. Ask your doctor what weight is healthy for you. If you need to lose weight, work with your doctor to do so safely.  Exercise for at least 30 minutes for 5 or more days each week, or as told by your doctor.  Wear loose-fitting clothes.  Do not smoke. If you need help quitting, ask your doctor.  Sleep with the head of your bed higher than your feet. Use a wedge under the mattress or blocks under the bed frame to raise the head of the bed. Summary  When you have gastroesophageal reflux disease (GERD), food and lifestyle choices are very important in easing your symptoms.  Eat small meals often instead of 3 large meals a day. Eat your meals slowly, and in a place where you are relaxed.  Limit high-fat foods such as fatty meat or fried foods.  Avoid bending over or lying down until 2-3 hours after eating.  Avoid peppermint and spearmint, caffeine, alcohol, and chocolate. This information is not intended to replace advice given to you by your health care provider. Make sure you discuss any questions you have with your health care provider.  Document Revised: 02/15/2019 Document Reviewed: 11/30/2016 Elsevier Patient Education  2020 [employer].

## 2020-05-14 NOTE — Progress Notes (Signed)
Synopsis: Referred in July 2021 for cough by Ileana Ladd, MD.  Subjective:   PATIENT ID: Debra [name] GENDER: female DOB: May 18, 1981, MRN: [id number]  Chief Complaint  Patient presents with  . Consult    Patient has had a cough since March-June. Patient currently does not have cough. Dry cough. Patient is on Pantoprazole and has noticed a little difference.     Mrs. Denio is a 39 year old woman who presents for evaluation of chronic cough.  She had a cough from March to June of this year, refractory to multiple treatments.  No associated wheezing, shortness of breath, or chest tightness.  She was seen by multiple doctors, including urgent care in April, where it was assumed that her cough is due to upper airway cough syndrome and allergies.   At that time she was negative for Covid.  At that time she was treated with amoxicillin, albuterol, codeine cough syrup, Tessalon, antihistamine, Flonase.  Her symptoms were not improved after this.  She was treated with montelukast, Arnuity, and albuterol as needed which helped her symptoms short-term, but she had recurrence.  After that she was treated with a steroid shot, which finally resolved her symptoms.  Her father has a history of bronchial hypersensitivity with similar symptoms of protracted coughing.  No other family history of lung disease.  She has no previous history of allergies-no nasal congestion, rhinorrhea, itching of eyes/nose, sneezing, etc.  She has lived in [place] for 10 years; she previously lived in [place].  No fever, chills, sweats, change in appetite or weight, Raynaud's, rashes, musculoskeletal symptoms. During her work-up she was started on pantoprazole, which has helped with her heartburn, but did not improve her cough at the time.  She quit smoking greater than 10 years ago after socially smoking for about 2 to 3 years.    Past Medical History:  Diagnosis Date  . Abnormal Pap smear of cervix 01-16-14   -pap  nl but Pos.HR HPV/Colpo 02-20-14--LSIL--needs repeat pap 38yr.  Marland Kitchen Anxiety    in therapy again 10/2017  . Hypercholesterolemia 2016  . Hypertension   . Miscarriage 05/14/2018  . Pregnancy induced hypertension      Family History  Problem Relation Age of Onset  . Thyroid disease Mother   . Osteoarthritis Mother   . Hypertension Father   . Hypertension Brother   . Rheum arthritis Maternal Grandmother      Past Surgical History:  Procedure Laterality Date  . CESAREAN SECTION N/A 06/07/2019   Procedure: CESAREAN SECTION;  Surgeon: Candice Camp, MD;  Location: [facility] LD ORS;  Service: Obstetrics;  Laterality: N/A;  Primary edc 8/17 NKDA need RNFA  . COLPOSCOPY    . ROTATOR CUFF REPAIR Right 07/2017   Dr.Norris--[facility]--bone spur/arthritis  . TOE SURGERY Bilateral    -as child--in [place]  . TONSILLECTOMY AND ADENOIDECTOMY     -age 14    Social History   Socioeconomic History  . Marital status: Married    Spouse name: Not on file  . Number of children: Not on file  . Years of education: Not on file  . Highest education level: Not on file  Occupational History  . Not on file  Tobacco Use  . Smoking status: Former Smoker    Packs/day: 0.25    Types: Cigarettes    Quit date: 05/14/2009    Years since quitting: 11.0  . Smokeless tobacco: Never Used  . Tobacco comment: 2-3 a day, 1 pack per week  Vaping  Use  . Vaping Use: Never used  Substance and Sexual Activity  . Alcohol use: Not Currently  . Drug use: No  . Sexual activity: Yes    Partners: Male    Birth control/protection: None  Other Topics Concern  . Not on file  Social History Narrative  . Not on file   Social Determinants of Health   Financial Resource Strain: Low Risk   . Difficulty of Paying Living Expenses: Not hard at all  Food Insecurity: No Food Insecurity  . Worried About Programme researcher, broadcasting/film/videounning Out of Food in the Last Year: Never true  . Ran Out of Food in the Last Year: Never true  Transportation Needs: Unknown    . Lack of Transportation (Medical): No  . Lack of Transportation (Non-Medical): Not on file  Physical Activity:   . Days of Exercise per Week:   . Minutes of Exercise per Session:   Stress: Stress Concern Present  . Feeling of Stress : To some extent  Social Connections:   . Frequency of Communication with Friends and Family:   . Frequency of Social Gatherings with Friends and Family:   . Attends Religious Services:   . Active Member of Clubs or Organizations:   . Attends BankerClub or Organization Meetings:   Marland Kitchen. Marital Status:   Intimate Partner Violence: Not At Risk  . Fear of Current or Ex-Partner: No  . Emotionally Abused: No  . Physically Abused: No  . Sexually Abused: No     No Known Allergies   Immunization History  Administered Date(s) Administered  . Influenza Inj Mdck Quad Pf 07/29/2018  . Influenza Inj Mdck Quad With Preservative 10/18/2017  . Influenza,inj,Quad PF,6-35 Mos 07/16/2019  . Influenza-Unspecified 10/18/2017  . PFIZER SARS-COV-2 Vaccination 01/19/2020, 02/20/2020    Outpatient Medications Prior to Visit  Medication Sig Dispense Refill  . albuterol (VENTOLIN HFA) 108 (90 Base) MCG/ACT inhaler Inhale 2 puffs into the lungs as needed.    . pantoprazole (PROTONIX) 40 MG tablet Take 40 mg by mouth daily.    . sertraline (ZOLOFT) 50 MG tablet Take 50 mg by mouth daily.    Marland Kitchen. docusate sodium (COLACE) 100 MG capsule Take 1 capsule (100 mg total) by mouth 2 (two) times daily. 60 capsule 2  . ibuprofen (ADVIL) 600 MG tablet Take 1 tablet (600 mg total) by mouth every 6 (six) hours as needed. 30 tablet 0  . labetalol (NORMODYNE) 200 MG tablet Take 200 mg by mouth 2 (two) times daily.    Marland Kitchen. oxyCODONE (OXY IR/ROXICODONE) 5 MG immediate release tablet Take 1 tablet (5 mg total) by mouth every 4 (four) hours as needed for severe pain. 15 tablet 0  . Prenatal Vit-Fe Fumarate-FA (PRENATAL PO) Take 1 tablet by mouth daily.      No facility-administered medications prior to  visit.    Review of Systems  Constitutional: Negative for chills, fever and weight loss.       Appetite stable  HENT: Negative for congestion.   Respiratory: Positive for cough. Negative for shortness of breath and wheezing.   Cardiovascular: Negative for chest pain and leg swelling.  Gastrointestinal: Negative for blood in stool, heartburn, nausea and vomiting.  Genitourinary: Negative for hematuria.  Musculoskeletal: Negative for joint pain.  Skin: Negative for rash.  Neurological: Negative.   Endo/Heme/Allergies: Negative for environmental allergies.     Objective:   Vitals:   05/14/20 0938  BP: 120/88  Pulse: 95  Temp: 98.1 F (36.7 C)  TempSrc: Oral  SpO2: 98%  Weight: 296 lb 6.4 oz (134.4 kg)  Height: 5' 3.19" (1.605 m)   98% on  RA BMI Readings from Last 3 Encounters:  05/14/20 52.19 kg/m  06/07/19 51.37 kg/m  06/05/19 50.49 kg/m   Wt Readings from Last 3 Encounters:  05/14/20 296 lb 6.4 oz (134.4 kg)  06/07/19 290 lb (131.5 kg)  06/05/19 285 lb (129.3 kg)    Physical Exam Vitals reviewed.  Constitutional:      General: She is not in acute distress.    Appearance: Normal appearance. She is obese. She is not ill-appearing.  HENT:     Head: Normocephalic and atraumatic.     Nose:     Comments: No nasal erythema or drainage    Mouth/Throat:     Comments: Mallampati 4, no erythema of posterior palate. Eyes:     General: No scleral icterus. Cardiovascular:     Rate and Rhythm: Normal rate and regular rhythm.     Heart sounds: No murmur heard.   Pulmonary:     Comments: Breathing comfortably on room air, no conversational dyspnea.  No observed coughing.  Clear to auscultation bilaterally. Abdominal:     General: There is no distension.     Palpations: Abdomen is soft.  Musculoskeletal:        General: No swelling or deformity.     Cervical back: Neck supple.  Lymphadenopathy:     Cervical: No cervical adenopathy.  Skin:    General: Skin is  warm and dry.     Findings: No erythema.  Neurological:     General: No focal deficit present.     Mental Status: She is alert.     Coordination: Coordination normal.  Psychiatric:        Mood and Affect: Mood normal.        Behavior: Behavior normal.      CBC    Component Value Date/Time   WBC 14.1 (H) 06/08/2019 0535   RBC 3.13 (L) 06/08/2019 0535   HGB 9.5 (L) 06/08/2019 0535   HCT 29.1 (L) 06/08/2019 0535   PLT 304 06/08/2019 0535   MCV 93.0 06/08/2019 0535   MCH 30.4 06/08/2019 0535   MCHC 32.6 06/08/2019 0535   RDW 13.9 06/08/2019 0535   LYMPHSABS 3.3 10/20/2018 1706   MONOABS 0.4 10/20/2018 1706   EOSABS 0.2 10/20/2018 1706   BASOSABS 0.1 10/20/2018 1706    CHEMISTRY No results for input(s): NA, K, CL, CO2, GLUCOSE, BUN, CREATININE, CALCIUM, MG, PHOS in the last 168 hours. CrCl cannot be calculated (Patient's most recent lab result is older than the maximum 21 days allowed.).   Chest Imaging- films reviewed: None available  Pulmonary Functions Testing Results: No flowsheet data found.      Assessment & Plan:     ICD-10-CM   1. Chronic cough  R05 Pulmonary function test  2. Gastroesophageal reflux disease, unspecified whether esophagitis present  K21.9    Episodic cough chronically; resolved.  Likely mild intermittent asthma triggered by acute viral illness. GERD could worsen this but may not fully explain her symptoms. -PFTs -Can resume Arnuity and albuterol as needed -Have requested that she make an acute visit for an exam in person if she develops recurrent symptoms.  GERD -Continue pantoprazole -Discussed dietary modifications  RTC in 2 months after PFTs.   Current Outpatient Medications:  .  albuterol (VENTOLIN HFA) 108 (90 Base) MCG/ACT inhaler, Inhale 2 puffs into the lungs as needed., Disp: , Rfl:  .  pantoprazole (PROTONIX) 40 MG tablet, Take 40 mg by mouth daily., Disp: , Rfl:  .  sertraline (ZOLOFT) 50 MG tablet, Take 50 mg by mouth  daily., Disp: , Rfl:      Steffanie Dunn, DO [facility] 05/14/2020 9:53 AM

## 2020-07-25 ENCOUNTER — Other Ambulatory Visit: Payer: Commercial Managed Care - PPO

## 2020-07-25 ENCOUNTER — Other Ambulatory Visit: Payer: Self-pay | Admitting: Sleep Medicine

## 2020-07-25 DIAGNOSIS — Z20822 Contact with and (suspected) exposure to covid-19: Secondary | ICD-10-CM

## 2020-07-28 LAB — NOVEL CORONAVIRUS, NAA: SARS-CoV-2, NAA: NOT DETECTED

## 2020-08-07 ENCOUNTER — Other Ambulatory Visit: Payer: Commercial Managed Care - PPO

## 2020-08-07 DIAGNOSIS — Z20822 Contact with and (suspected) exposure to covid-19: Secondary | ICD-10-CM

## 2020-08-08 LAB — SARS-COV-2, NAA 2 DAY TAT

## 2020-08-08 LAB — NOVEL CORONAVIRUS, NAA: SARS-CoV-2, NAA: NOT DETECTED

## 2020-11-09 ENCOUNTER — Ambulatory Visit (HOSPITAL_COMMUNITY): Admit: 2020-11-09 | Discharge status: Against Medical Advice | Payer: Commercial Managed Care - PPO

## 2020-11-13 ENCOUNTER — Telehealth: Payer: Self-pay | Admitting: Critical Care Medicine

## 2020-11-13 NOTE — Telephone Encounter (Signed)
Dr. Chestine Spore patient, fu chronic cough. Apt scheduled for 11/18/20 with Tammy Parrett.

## 2020-11-13 NOTE — Telephone Encounter (Signed)
She has covid test pending, if positive she will notify our office and change to televisit

## 2020-11-16 IMAGING — US US OB < 14 WEEKS - US OB TV
1 series · 15 of 28 positions shown · non-contrast
Comparison: None.

CLINICAL DATA: Initial evaluation for acute pelvic pain for 1 day.

EXAM:
OBSTETRIC <14 WK US AND TRANSVAGINAL OB US
TECHNIQUE: Both transabdominal and transvaginal ultrasound examinations were
performed for complete evaluation of the gestation as well as the
maternal uterus, adnexal regions, and pelvic cul-de-sac.
Transvaginal technique was performed to assess early pregnancy.

[Series 1: us ob < 14 weeks - us ob tv · 15 of 32 slices shown]
[im 1/32]
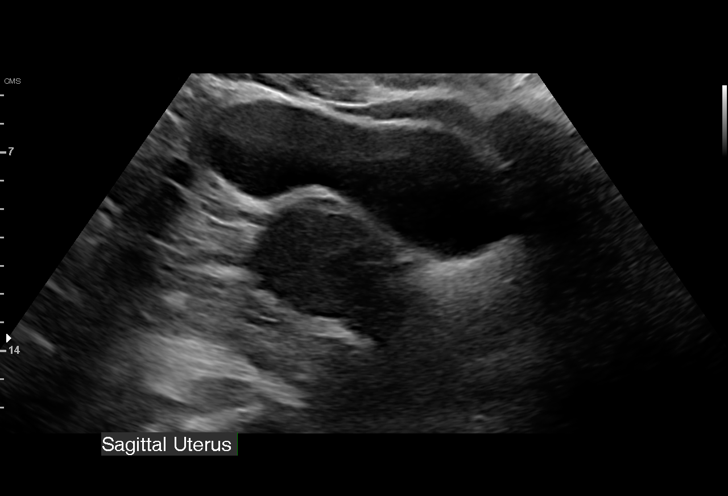
[im 3/32]
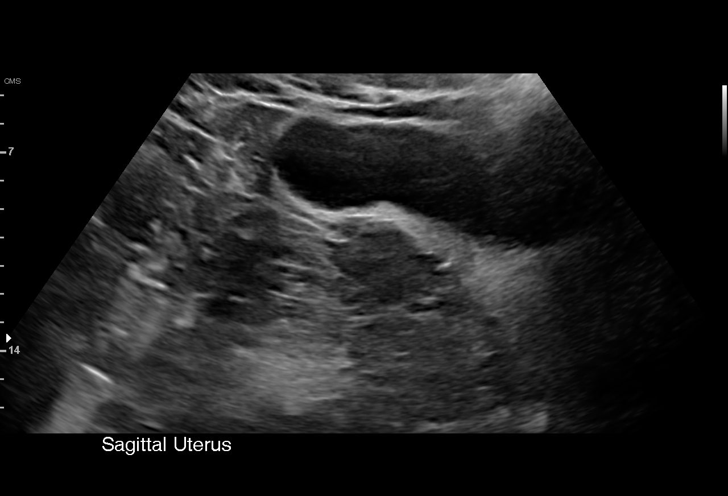
[im 5/32]
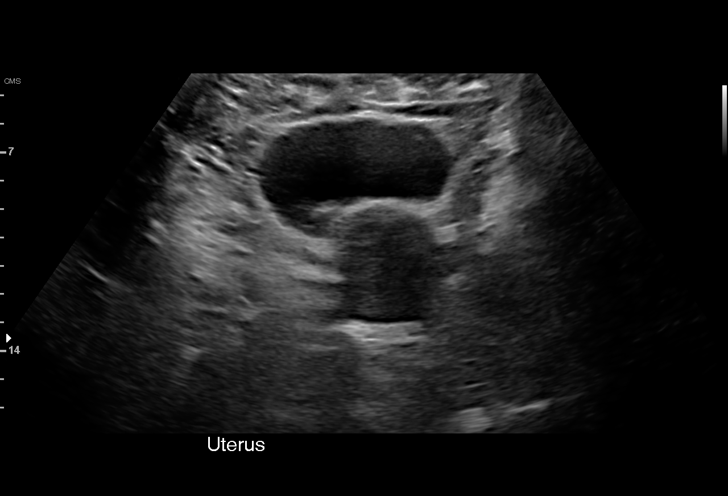
[im 7/32]
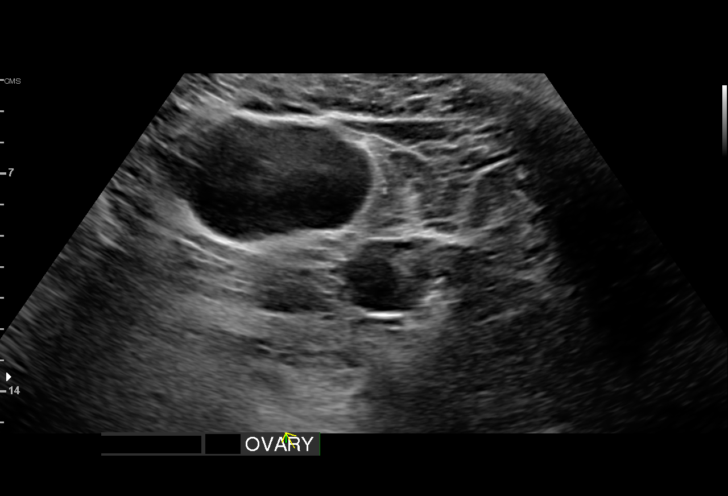
[im 10/32]
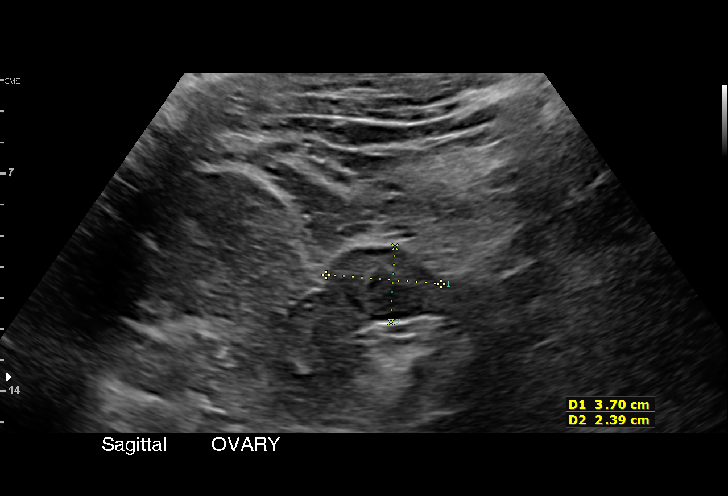
[im 12/32]
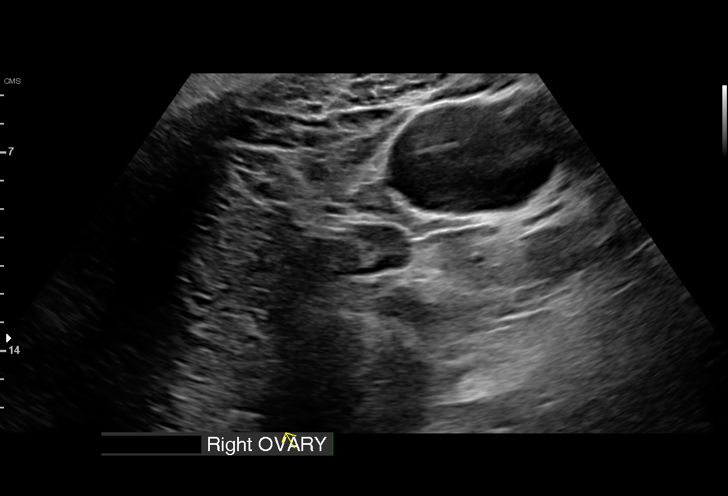
[im 14/32]
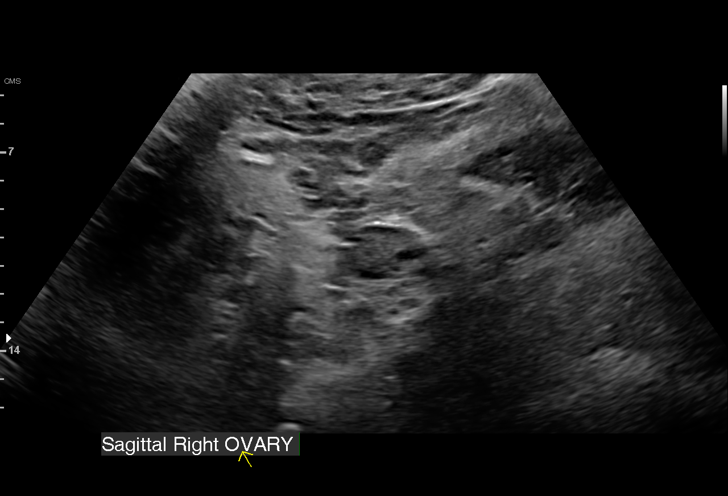
[im 17/32]
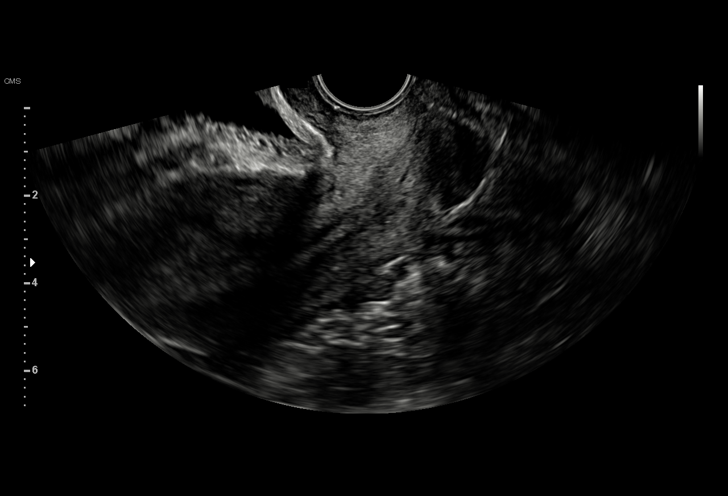
[im 18/32]
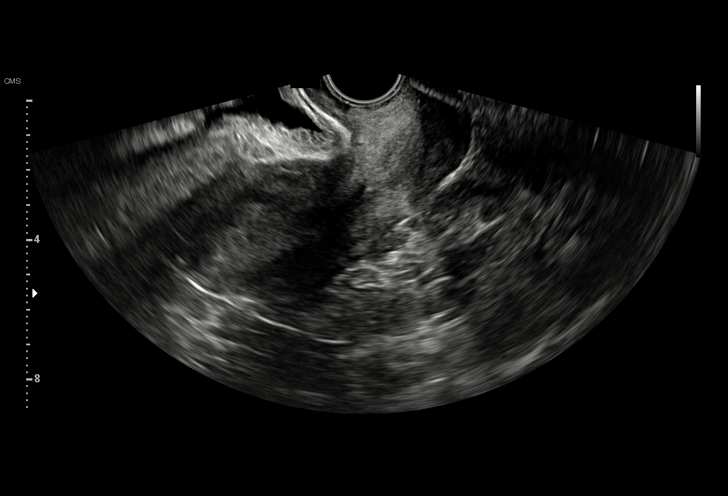
[im 20/32]
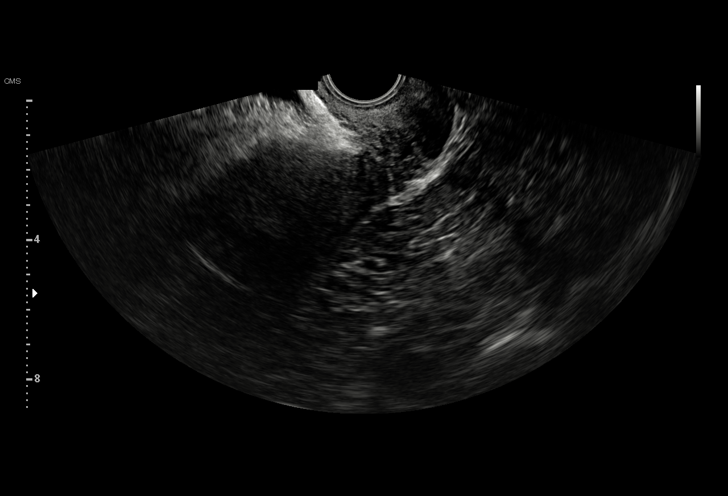
[im 22/32]
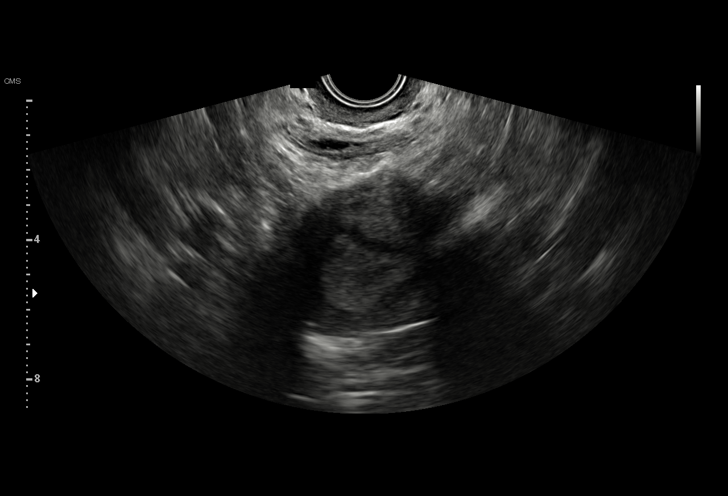
[im 25/32]
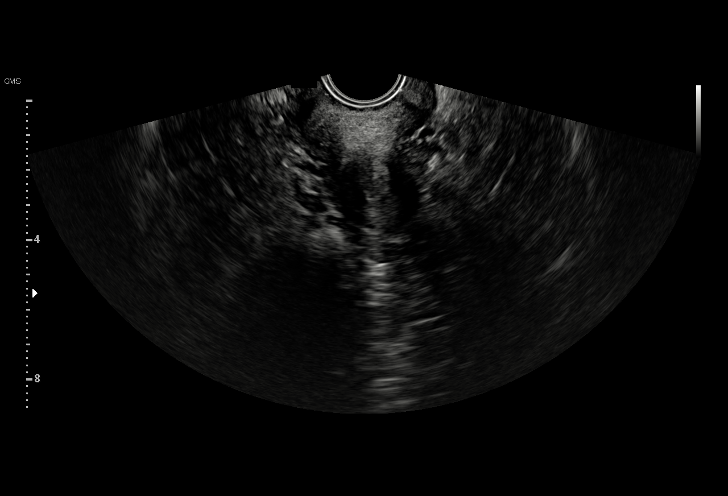
[im 27/32]
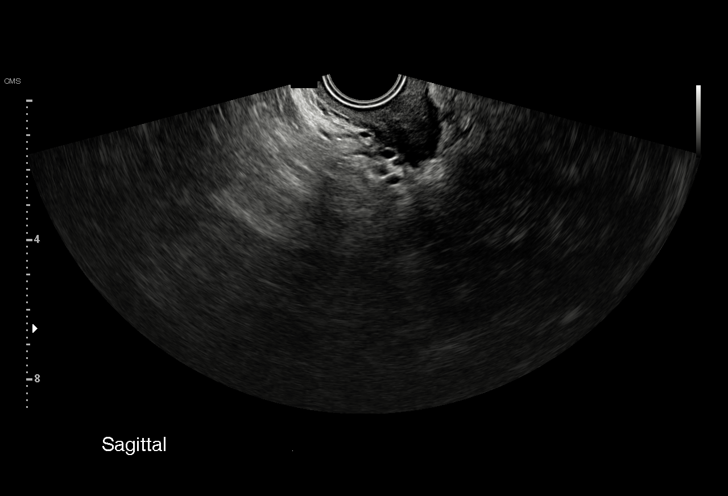
[im 29/32]
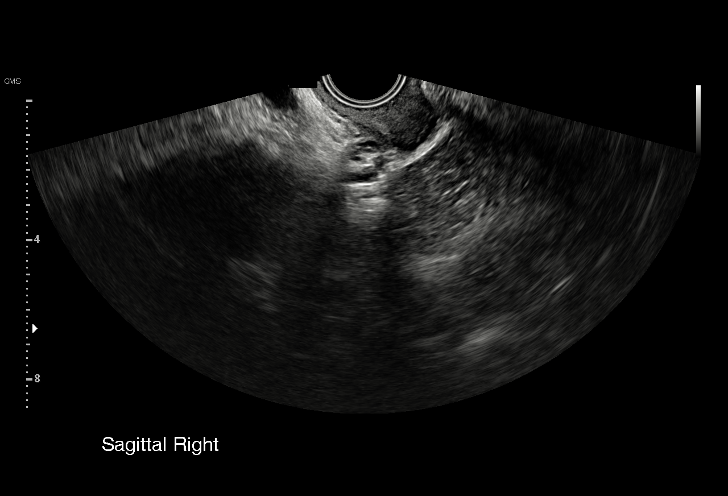
[im 32/32]
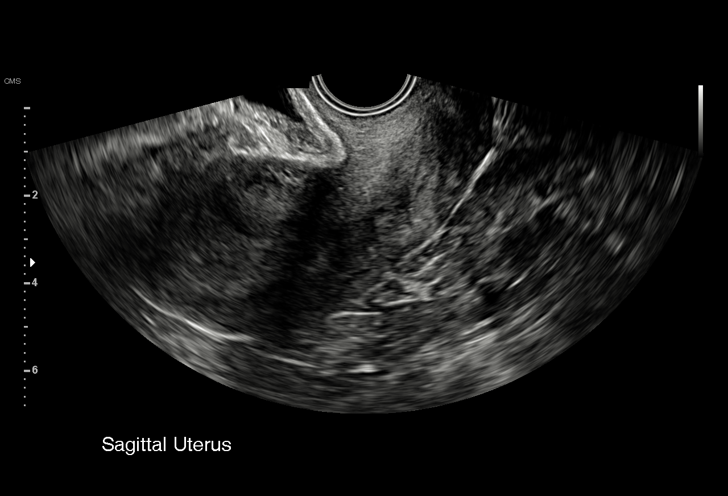

[15 of 28 positions shown; findings below may reference images not displayed]

FINDINGS: Intrauterine gestational sac: Not visualized.

Yolk sac:  Negative.

Embryo:  Negative.

Cardiac Activity: N/A

Heart Rate: N/A  bpm

Subchorionic hemorrhage:  None visualized.

Maternal uterus/adnexae: Ovaries are normal in appearance
bilaterally. No free fluid within the pelvis.
IMPRESSION: 1. Early pregnancy with no discrete IUP or adnexal mass identified.
Finding is consistent with a pregnancy of unknown anatomic location.
Differential considerations include IUP to early to visualize,
recent SAB, or possibly occult ectopic pregnancy. Close clinical
monitoring with serial beta HCGs and close interval follow-up
ultrasound recommended as clinically warranted.
2. No other acute maternal uterine or adnexal abnormality
identified.

## 2020-11-18 ENCOUNTER — Encounter: Payer: Self-pay | Admitting: Adult Health

## 2020-11-18 ENCOUNTER — Ambulatory Visit (INDEPENDENT_AMBULATORY_CARE_PROVIDER_SITE_OTHER): Payer: Commercial Managed Care - PPO

## 2020-11-18 ENCOUNTER — Ambulatory Visit (INDEPENDENT_AMBULATORY_CARE_PROVIDER_SITE_OTHER): Payer: Commercial Managed Care - PPO | Admitting: Adult Health

## 2020-11-18 ENCOUNTER — Other Ambulatory Visit: Payer: Self-pay

## 2020-11-18 VITALS — BP 140/90 | HR 99 | Temp 98.0°F | Ht 63.0 in | Wt 298.4 lb

## 2020-11-18 DIAGNOSIS — R059 Cough, unspecified: Secondary | ICD-10-CM | POA: Diagnosis not present

## 2020-11-18 DIAGNOSIS — R058 Other specified cough: Secondary | ICD-10-CM | POA: Diagnosis not present

## 2020-11-18 MED ORDER — BENZONATATE 200 MG PO CAPS: 200.0000 mg | ORAL_CAPSULE | Freq: Three times a day (TID) | ORAL | 1 refills | Status: DC | PRN

## 2020-11-18 MED ORDER — PREDNISONE 20 MG PO TABS: 20.0000 mg | ORAL_TABLET | Freq: Every day | ORAL | 0 refills | Status: DC

## 2020-11-18 NOTE — Patient Instructions (Addendum)
Prednisone 20mg  daily 5 days  Begin Delsym 2 tsp Twice daily As needed  Cough .  Begin Tessalon Three times a day  For cough As needed   Begin Pepcid 20mg  Twice daily  .  Begin Zyrtec 10mg  daily in am .  Begin Chlortrimeton 4mg  1-2 At bedtime  For drainage/cough.  Sips of water  Albuterol inhaler 1-2 puffs every 6hrs as needed.  Chest xray and labs today .  Follow up in 2-3 weeks and As needed   Please contact office for sooner follow up if symptoms do not improve or worsen or seek emergency care

## 2020-11-18 NOTE — Progress Notes (Unsigned)
@Patient  ID: , female    DOB: 08-04-1981, 40 y.o.   MRN: [id number]  Chief Complaint  Patient presents with  . Follow-up    Referring provider: [id number], MD  HPI: 40 yo female former smoker seen for pulmonary consult 05/14/2020 for chronic cough for 4 months  From 07/15/2020 , been in [place] since 2012  Medical history significant for HTN, Hyperlipidemia   TEST/EVENTS :   11/18/2020 Acute OV : Cough  Patient presents for an acute office visit. Was seen earlier this year for chronic cough , got better. She was recommended to take Ventolin As needed . Begin Protonix .  Return for PFTs. She says did get better. Complains 3 weeks dry cough started that will not improve , has been treated at urgent care with steroid taper with minimal  improvement . Does have a dog. 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 No basement or hot tub. No hemoptysis, dysphagia, orthopnea, edema , rash.  Some sinus drainage. Occasional heartburn. Did not return for PFT.  No recent chest xray .    No Known Allergies  Immunization History  Administered Date(s) Administered  . Influenza Inj Mdck Quad Pf 07/29/2018  . Influenza Inj Mdck Quad With Preservative 10/18/2017  . Influenza,inj,Quad PF,6+ Mos 07/09/2020  . Influenza,inj,Quad PF,6-35 Mos 07/16/2019  . Influenza-Unspecified 10/18/2017  . PFIZER SARS-COV-2 Vaccination 01/19/2020, 02/20/2020, 09/15/2020    Past Medical History:  Diagnosis Date  . Abnormal Pap smear of cervix 01-16-14   -pap nl but Pos.HR HPV/Colpo 02-20-14--LSIL--needs repeat pap 45yr.  3yr Anxiety    in therapy again 10/2017  . Hypercholesterolemia 2016  . Hypertension   . Miscarriage 05/14/2018  . Pregnancy induced hypertension     Tobacco History: Social History   Tobacco Use  Smoking Status Former Smoker  . Packs/day: 0.25  . Types: Cigarettes  . Quit date: 05/14/2009  . Years since quitting: 11.5  Smokeless Tobacco Never Used  Tobacco Comment   2-3 a day, 1 pack per  week   Counseling given: Not Answered Comment: 2-3 a day, 1 pack per week   Outpatient Medications Prior to Visit  Medication Sig Dispense Refill  . albuterol (VENTOLIN HFA) 108 (90 Base) MCG/ACT inhaler Inhale 2 puffs into the lungs as needed.    . gabapentin (NEURONTIN) 100 MG capsule Take 100 mg by mouth 3 (three) times daily. (Patient not taking: Reported on 11/18/2020)    . pantoprazole (PROTONIX) 40 MG tablet Take 40 mg by mouth daily.    . sertraline (ZOLOFT) 50 MG tablet Take 50 mg by mouth daily.     No facility-administered medications prior to visit.     Review of Systems:   Constitutional:   No  weight loss, night sweats,  Fevers, chills, fatigue, or  lassitude.  HEENT:   No headaches,  Difficulty swallowing,  Tooth/dental problems, or  Sore throat,                No sneezing, itching, ear ache, + nasal congestion, post nasal drip,   CV:  No chest pain,  Orthopnea, PND, swelling in lower extremities, anasarca, dizziness, palpitations, syncope.   GI  No heartburn, indigestion, abdominal pain, nausea, vomiting, diarrhea, change in bowel habits, loss of appetite, bloody stools.   Resp:    No chest wall deformity  Skin: no rash or lesions.  GU: no dysuria, change in color of urine, no urgency or frequency.  No flank pain, no hematuria   MS:  No joint pain or swelling.  No decreased range of motion.  No back pain.    Physical Exam  BP 140/90 (BP Location: Right Arm, Patient Position: Sitting, Cuff Size: Large)   Pulse 99   Temp 98 F (36.7 C) (Temporal)   Ht 5\' 3"  (1.6 m)   Wt 298 lb 6.4 oz (135.4 kg)   SpO2 99%   BMI 52.86 kg/m   GEN: A/Ox3; pleasant , NAD, well nourished    HEENT:  [place]/AT,    NOSE-clear, THROAT-clear, no lesions, no postnasal drip or exudate noted.   NECK:  Supple w/ fair ROM; no JVD; normal carotid impulses w/o bruits; no thyromegaly or nodules palpated; no lymphadenopathy.    RESP  Clear  P & A; w/o, wheezes/ rales/ or rhonchi. no  accessory muscle use, no dullness to percussion  CARD:  RRR, no m/r/g, no peripheral edema, pulses intact, no cyanosis or clubbing.  GI:   Soft & nt; nml bowel sounds; no organomegaly or masses detected.   Musco: Warm bil, no deformities or joint swelling noted.   Neuro: alert, no focal deficits noted.    Skin: Warm, no lesions or rashes    Lab Results:  BNP No results found for: BNP  ProBNP No results found for: PROBNP  Imaging: No results found.    No flowsheet data found.  No results found for: NITRICOXIDE      Assessment & Plan:   No problem-specific Assessment & Plan notes found for this encounter.     , NP 11/18/2020

## 2020-11-19 LAB — CBC WITH DIFFERENTIAL/PLATELET
Basophils Absolute: 0.1 10*3/uL (ref 0.0–0.1)
Basophils Relative: 0.6 % (ref 0.0–3.0)
Eosinophils Absolute: 0.5 10*3/uL (ref 0.0–0.7)
Eosinophils Relative: 3 % (ref 0.0–5.0)
HCT: 43.4 % (ref 36.0–46.0)
Hemoglobin: 14.7 g/dL (ref 12.0–15.0)
Lymphocytes Relative: 25 % (ref 12.0–46.0)
Lymphs Abs: 4.1 10*3/uL — ABNORMAL HIGH (ref 0.7–4.0)
MCHC: 33.8 g/dL (ref 30.0–36.0)
MCV: 86.6 fl (ref 78.0–100.0)
Monocytes Absolute: 0.9 10*3/uL (ref 0.1–1.0)
Monocytes Relative: 5.7 % (ref 3.0–12.0)
Neutro Abs: 10.8 10*3/uL — ABNORMAL HIGH (ref 1.4–7.7)
Neutrophils Relative %: 65.7 % (ref 43.0–77.0)
Platelets: 430 10*3/uL — ABNORMAL HIGH (ref 150.0–400.0)
RBC: 5.01 Mil/uL (ref 3.87–5.11)
RDW: 14.3 % (ref 11.5–15.5)
WBC: 16.4 10*3/uL — ABNORMAL HIGH (ref 4.0–10.5)

## 2020-11-19 LAB — IGE: IgE (Immunoglobulin E), Serum: 16 kU/L (ref <=114)

## 2020-11-20 DIAGNOSIS — R058 Other specified cough: Secondary | ICD-10-CM | POA: Insufficient documentation

## 2020-11-20 NOTE — Assessment & Plan Note (Signed)
[facility] -  Need to check Chest xray  Short steroid course.  Begin cough control regimen  Trigger prevention for GERD/AR  If improved on return will need PFT going forward.  Check labs with IgE and CBC w/ diff.   Plan  Patient Instructions  Prednisone 20mg  daily 5 days  Begin Delsym 2 tsp Twice daily As needed  Cough .  Begin Tessalon Three times a day  For cough As needed   Begin Pepcid 20mg  Twice daily  .  Begin Zyrtec 10mg  daily in am .  Begin Chlortrimeton 4mg  1-2 At bedtime  For drainage/cough.  Sips of water  Albuterol inhaler 1-2 puffs every 6hrs as needed.  Chest xray and labs today .  Follow up in 2-3 weeks and As needed   Please contact office for sooner follow up if symptoms do not improve or worsen or seek emergency care

## 2020-11-25 ENCOUNTER — Telehealth: Payer: Self-pay | Admitting: Adult Health

## 2020-11-25 NOTE — Progress Notes (Signed)
Called and spoke with patient, advised of results/recommendations per Tammy Parrett NP, she verbalized understanding.  Nothing further needed.

## 2020-11-25 NOTE — Telephone Encounter (Signed)
Called and spoke with patient, provided results/recommendations per Tammy Parrett NP.  She verbalized understanding.  Nothing further needed.

## 2020-11-25 NOTE — Progress Notes (Signed)
ATCx1.  LVM to return call regarding labs and x-ray.

## 2020-12-12 ENCOUNTER — Encounter: Payer: Self-pay | Admitting: Adult Health

## 2020-12-12 ENCOUNTER — Ambulatory Visit (INDEPENDENT_AMBULATORY_CARE_PROVIDER_SITE_OTHER): Payer: Commercial Managed Care - PPO | Admitting: Adult Health

## 2020-12-12 ENCOUNTER — Other Ambulatory Visit: Payer: Self-pay

## 2020-12-12 DIAGNOSIS — K219 Gastro-esophageal reflux disease without esophagitis: Secondary | ICD-10-CM | POA: Diagnosis not present

## 2020-12-12 DIAGNOSIS — R058 Other specified cough: Secondary | ICD-10-CM

## 2020-12-12 NOTE — Assessment & Plan Note (Signed)
Continue on GERD diet.  Continue on Pepcid

## 2020-12-12 NOTE — Patient Instructions (Signed)
Delsym 2 tsp Twice daily As needed  Cough .  Tessalon Three times a day  For cough As needed    Pepcid 20mg  Twice daily  .  Zyrtec 10mg  daily in am As needed   Chlortrimeton 4mg  1-2 At bedtime  For drainage/cough As needed   Albuterol inhaler 1-2 puffs every 6hrs as needed.  Follow up in 2-3 months with PFTs with Dr.  and As needed  (30 min slot )  Please contact office for sooner follow up if symptoms do not improve or worsen or seek emergency care

## 2020-12-12 NOTE — Progress Notes (Signed)
@Patient  ID: , female    DOB: [birth year], 40 y.o.   MRN: [id number]  Chief Complaint  Patient presents with  . Follow-up    Referring provider: [id number], MD  HPI: 40 year old female former smoker seen for pulmonary consult May 14, 2020 for chronic cough for 4 months. From May 16, 2020, been in the [place] since 2012 Medical history significant for hypertension hyperlipidemia  TEST/EVENTS :   12/12/2020 Follow up : Chronic Cough  Patient presents for a follow-up visit.  Patient was seen 1 month ago for a 3-week history of cough.   Chest x-ray showed no acute process.  Patient was recommended to begin a steroid taper.  Was placed on cough control regimen.  Along with Pepcid and Zyrtec Since last visit patient says she is feeling much better cough has totally resolved. Lab work showed IgE was okay at 02/09/2021 .  Eosinophil absolute count was 500.  White blood cell count was elevated.  She has been off of steroids for 2 weeks.  Had labs repeated at PCP , WBC at 10.9K  Patient says she is feeling much better.  Cough has totally resolved.  Breathing is back to normal.   No Known Allergies  Immunization History  Administered Date(s) Administered  . Influenza Inj Mdck Quad Pf 07/29/2018  . Influenza Inj Mdck Quad With Preservative 10/18/2017  . Influenza,inj,Quad PF,6+ Mos 07/09/2020  . Influenza,inj,Quad PF,6-35 Mos 07/16/2019  . Influenza-Unspecified 10/18/2017  . PFIZER(Purple Top)SARS-COV-2 Vaccination 01/19/2020, 02/20/2020, 09/15/2020    Past Medical History:  Diagnosis Date  . Abnormal Pap smear of cervix 01-16-14   -pap nl but Pos.HR HPV/Colpo 02-20-14--LSIL--needs repeat pap 30yr.  3yr Anxiety    in therapy again 10/2017  . Hypercholesterolemia 2016  . Hypertension   . Miscarriage 05/14/2018  . Pregnancy induced hypertension     Tobacco History: Social History   Tobacco Use  Smoking Status Former Smoker  . Packs/day: 0.25  . Types: Cigarettes  . Quit date:  05/14/2009  . Years since quitting: 11.5  Smokeless Tobacco Never Used  Tobacco Comment   2-3 a day, 1 pack per week   Counseling given: Not Answered Comment: 2-3 a day, 1 pack per week   Outpatient Medications Prior to Visit  Medication Sig Dispense Refill  . albuterol (VENTOLIN HFA) 108 (90 Base) MCG/ACT inhaler Inhale 2 puffs into the lungs as needed.    . gabapentin (NEURONTIN) 100 MG capsule Take 100 mg by mouth 3 (three) times daily.    . sertraline (ZOLOFT) 25 MG tablet Take 25 mg by mouth daily.    . benzonatate (TESSALON) 200 MG capsule Take 1 capsule (200 mg total) by mouth 3 (three) times daily as needed for cough. 30 capsule 1  . predniSONE (DELTASONE) 20 MG tablet Take 1 tablet (20 mg total) by mouth daily with breakfast. 5 tablet 0   No facility-administered medications prior to visit.     Review of Systems:   Constitutional:   No  weight loss, night sweats,  Fevers, chills, fatigue, or  lassitude.  HEENT:   No headaches,  Difficulty swallowing,  Tooth/dental problems, or  Sore throat,                No sneezing, itching, ear ache,  +nasal congestion, post nasal drip,   CV:  No chest pain,  Orthopnea, PND, swelling in lower extremities, anasarca, dizziness, palpitations, syncope.   GI  No heartburn, indigestion, abdominal pain, nausea, vomiting, diarrhea, change in  bowel habits, loss of appetite, bloody stools.   Resp: No shortness of breath with exertion or at rest.  No excess mucus, no productive cough,  No non-productive cough,  No coughing up of blood.  No change in color of mucus.  No wheezing.  No chest wall deformity  Skin: no rash or lesions.  GU: no dysuria, change in color of urine, no urgency or frequency.  No flank pain, no hematuria   MS:  No joint pain or swelling.  No decreased range of motion.  No back pain.    Physical Exam  BP 126/84 (BP Location: Right Arm, Cuff Size: Normal)   Pulse 98   Temp 97.8 F (36.6 C) (Temporal)   Ht 5\' 3"  (1.6  m)   Wt (!) 304 lb 12.8 oz (138.3 kg)   SpO2 98%   BMI 53.99 kg/m   GEN: A/Ox3; pleasant , NAD, well nourished    HEENT:  [place]/AT,    NOSE-clear, THROAT-clear, no lesions, no postnasal drip or exudate noted.   NECK:  Supple w/ fair ROM; no JVD; normal carotid impulses w/o bruits; no thyromegaly or nodules palpated; no lymphadenopathy.    RESP  Clear  P & A; w/o, wheezes/ rales/ or rhonchi. no accessory muscle use, no dullness to percussion  CARD:  RRR, no m/r/g, no peripheral edema, pulses intact, no cyanosis or clubbing.  GI:   Soft & nt; nml bowel sounds; no organomegaly or masses detected.   Musco: Warm bil, no deformities or joint swelling noted.   Neuro: alert, no focal deficits noted.    Skin: Warm, no lesions or rashes    Lab Results:  CBC    Component Value Date/Time   WBC 16.4 (H) 11/18/2020 1705   RBC 5.01 11/18/2020 1705   HGB 14.7 11/18/2020 1705   HCT 43.4 11/18/2020 1705   PLT 430.0 (H) 11/18/2020 1705   MCV 86.6 11/18/2020 1705   MCH 30.4 06/08/2019 0535   MCHC 33.8 11/18/2020 1705   RDW 14.3 11/18/2020 1705   LYMPHSABS 4.1 (H) 11/18/2020 1705   MONOABS 0.9 11/18/2020 1705   EOSABS 0.5 11/18/2020 1705   BASOSABS 0.1 11/18/2020 1705    BMET    Component Value Date/Time   NA 137 06/07/2019 1024   K 4.1 06/07/2019 1024   CL 105 06/07/2019 1024   CO2 23 06/07/2019 1024   GLUCOSE 109 (H) 06/07/2019 1024   BUN 6 06/07/2019 1024   CREATININE 0.61 06/07/2019 1024   CALCIUM 8.5 (L) 06/07/2019 1024   GFRNONAA >60 06/07/2019 1024   GFRAA >60 06/07/2019 1024    BNP No results found for: BNP  ProBNP No results found for: PROBNP  Imaging: DG Chest 2 View  Result Date: 11/18/2020 CLINICAL DATA:  Cough for 3 weeks EXAM: CHEST - 2 VIEW COMPARISON:  None. FINDINGS: The heart size and mediastinal contours are within normal limits. Both lungs are clear. The visualized skeletal structures are unremarkable. IMPRESSION: No active cardiopulmonary disease.  Electronically Signed   By: 01/16/2021 M.D.   On: 11/18/2020 17:21      No flowsheet data found.  No results found for: NITRICOXIDE      Assessment & Plan:   Upper airway cough syndrome Patient is much improved.  He seems to have reactive airways upper respiratory infections .  Would like her to return for full pulmonary function testing. Previous chest x-ray was clear. Lab work did show some elevated eosinophils.  Asked her to use  Zyrtec as needed.  GERD symptoms are much improved on Pepcid.  Plan  Patient Instructions  Delsym 2 tsp Twice daily As needed  Cough .  Tessalon Three times a day  For cough As needed    Pepcid 20mg  Twice daily  .  Zyrtec 10mg  daily in am As needed   Chlortrimeton 4mg  1-2 At bedtime  For drainage/cough As needed   Albuterol inhaler 1-2 puffs every 6hrs as needed.  Follow up in 2-3 months with PFTs with Dr.  and As needed  (30 min slot )  Please contact office for sooner follow up if symptoms do not improve or worsen or seek emergency care       GERD (gastroesophageal reflux disease) Continue on GERD diet.  Continue on , NP 12/12/2020

## 2020-12-12 NOTE — Assessment & Plan Note (Addendum)
Patient is much improved.  He seems to have reactive airways upper respiratory infections .  Would like her to return for full pulmonary function testing. Previous chest x-ray was clear. Lab work did show some elevated eosinophils.  Asked her to use Zyrtec as needed.  GERD symptoms are much improved on Pepcid.  Plan  Patient Instructions  Delsym 2 tsp Twice daily As needed  Cough .  Tessalon Three times a day  For cough As needed    Pepcid 20mg  Twice daily  .  Zyrtec 10mg  daily in am As needed   Chlortrimeton 4mg  1-2 At bedtime  For drainage/cough As needed   Albuterol inhaler 1-2 puffs every 6hrs as needed.  Follow up in 2-3 months with PFTs with Dr.  and As needed  (30 min slot )  Please contact office for sooner follow up if symptoms do not improve or worsen or seek emergency care

## 2020-12-16 IMAGING — US US OB < 14 WEEKS - US OB TV
1 series · 15 of 27 positions shown · non-contrast
Comparison: 10/20/2018

CLINICAL DATA: Abdominal pain in 1st trimester pregnancy.
Gestational age by LMP of 9 weeks 6 days.

EXAM:
OBSTETRIC <14 WK US AND TRANSVAGINAL OB US
TECHNIQUE: Both transabdominal and transvaginal ultrasound examinations were
performed for complete evaluation of the gestation as well as the
maternal uterus, adnexal regions, and pelvic cul-de-sac.
Transvaginal technique was performed to assess early pregnancy.

[Series 1: us ob < 14 weeks - us ob tv · 15 of 27 slices shown]
[im 1/27]
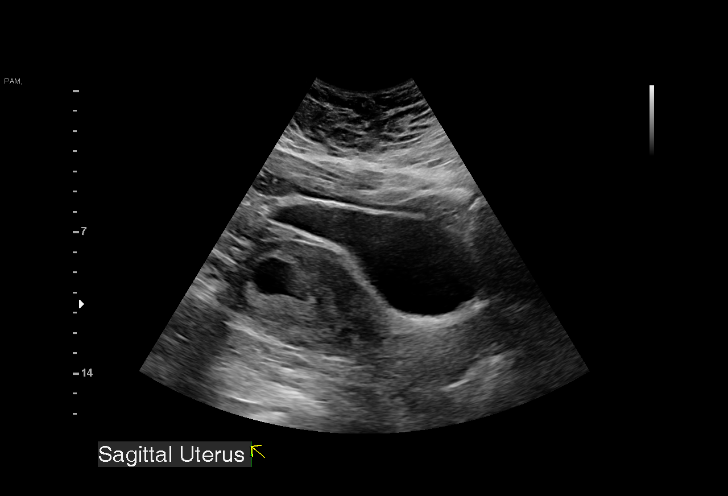
[im 3/27]
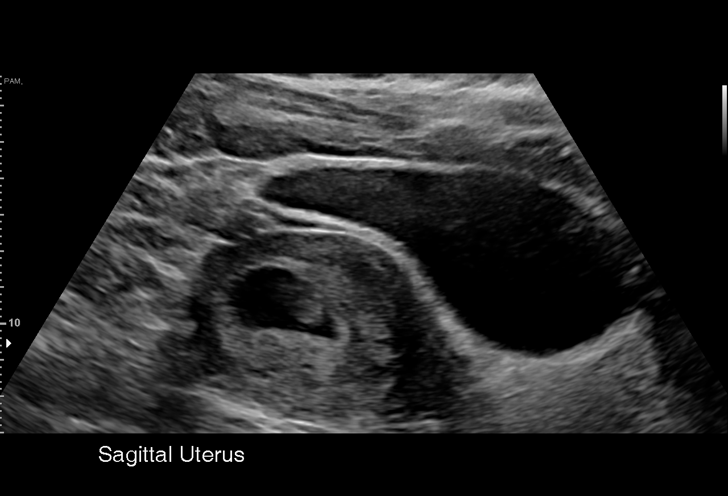
[im 5/27]
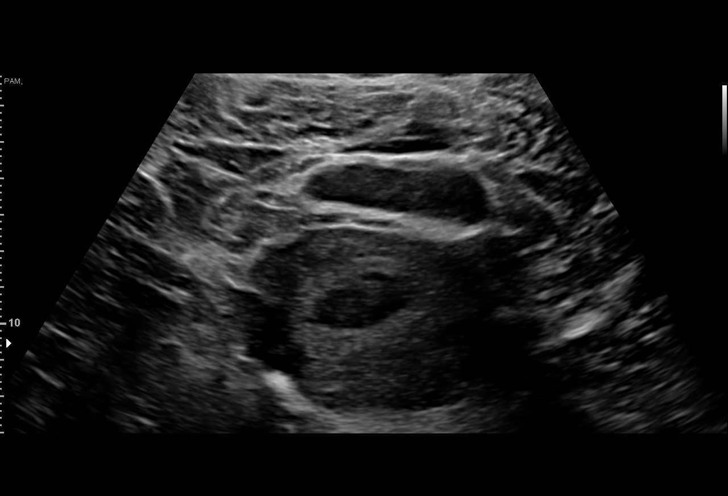
[im 7/27]
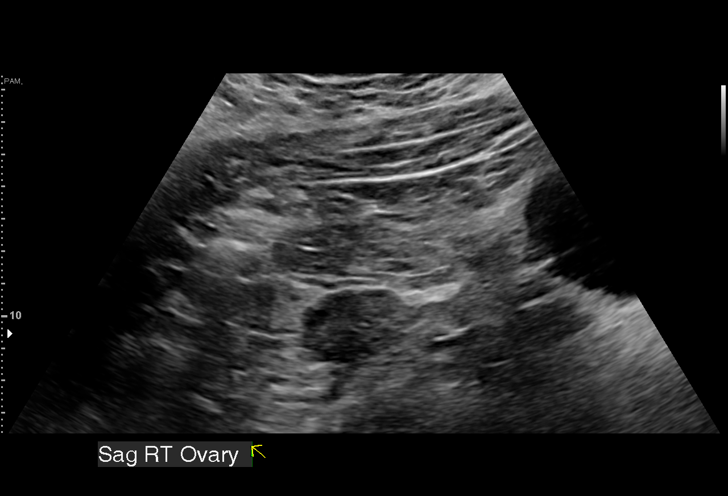
[im 9/27]
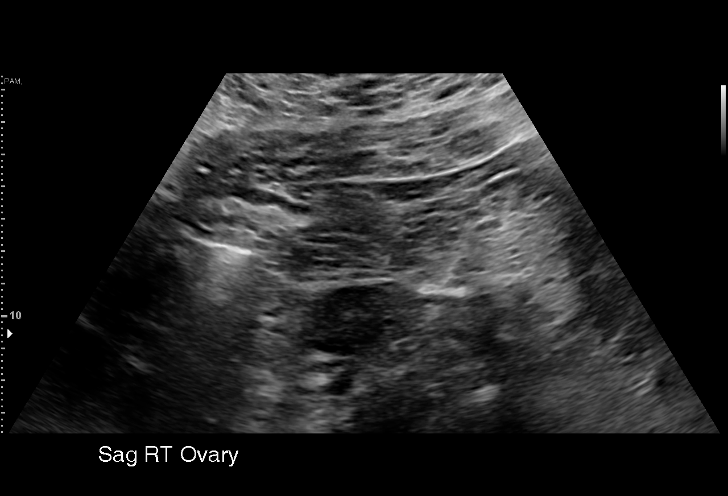
[im 10/27]
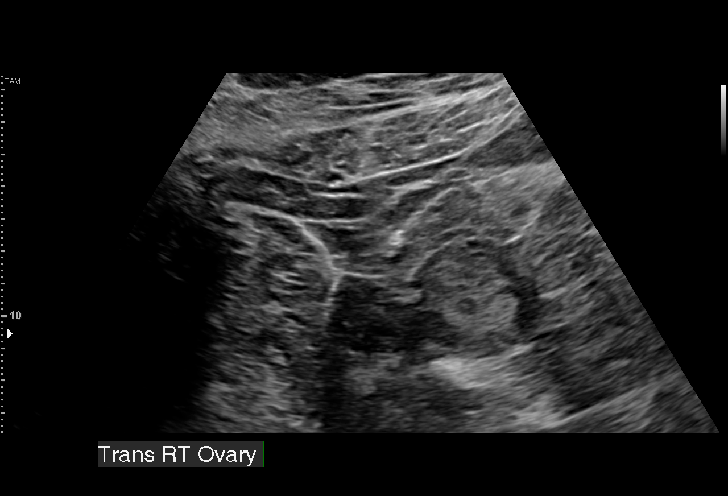
[im 12/27]
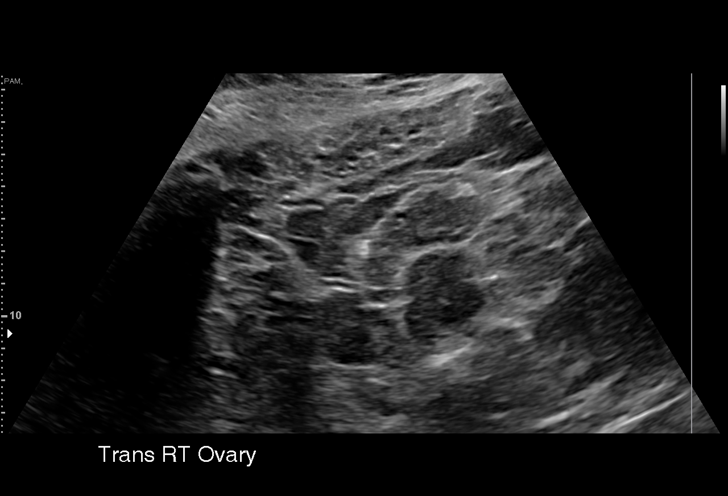
[im 14/27]
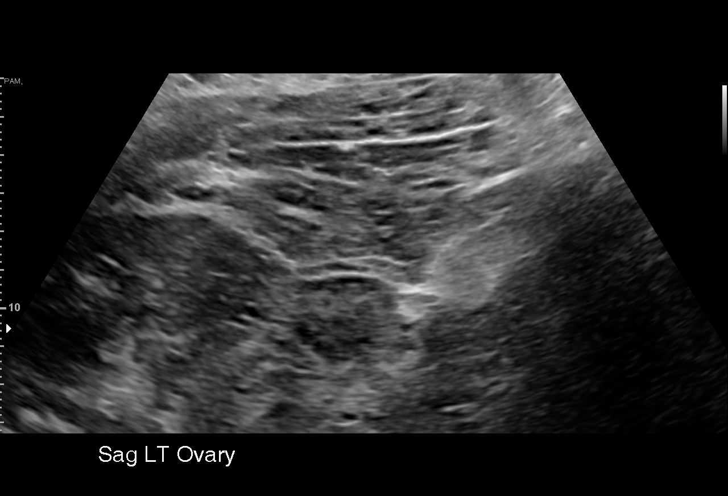
[im 16/27]
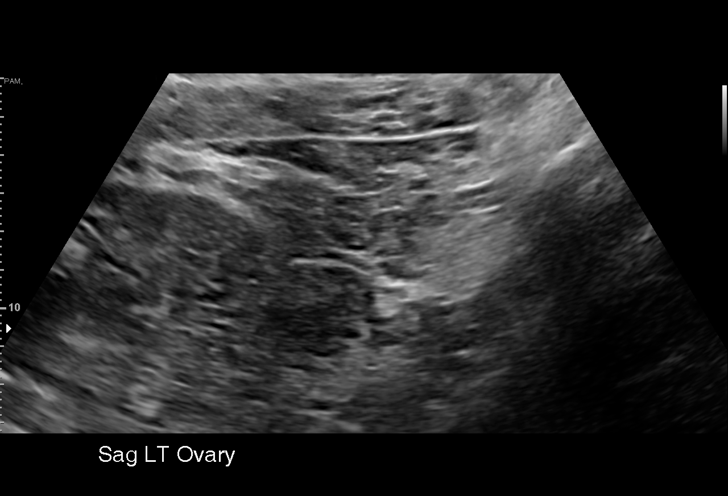
[im 18/27]
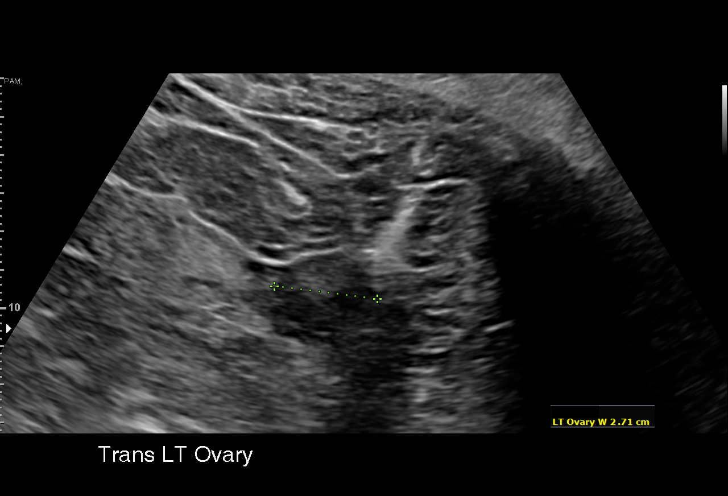
[im 19/27]
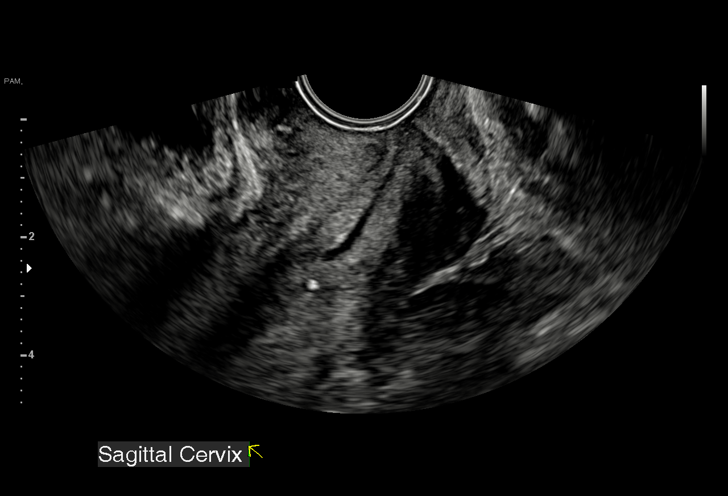
[im 21/27]
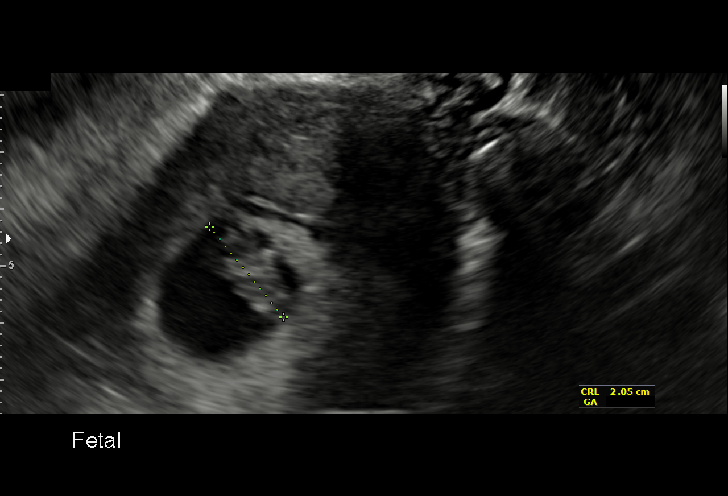
[im 23/27]
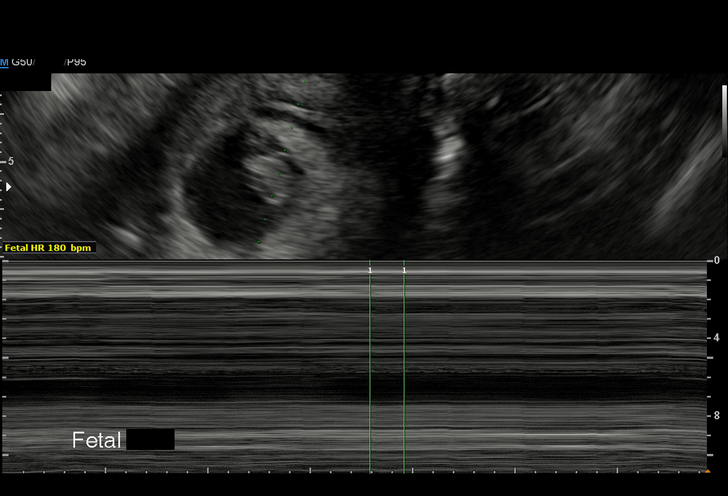
[im 25/27]
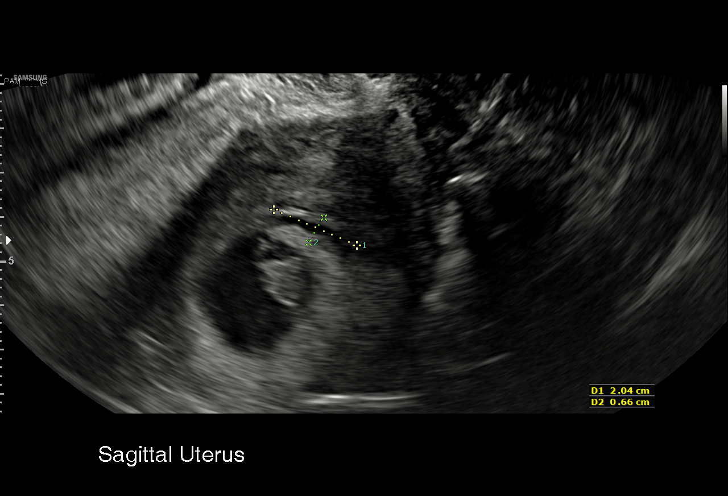
[im 27/27]
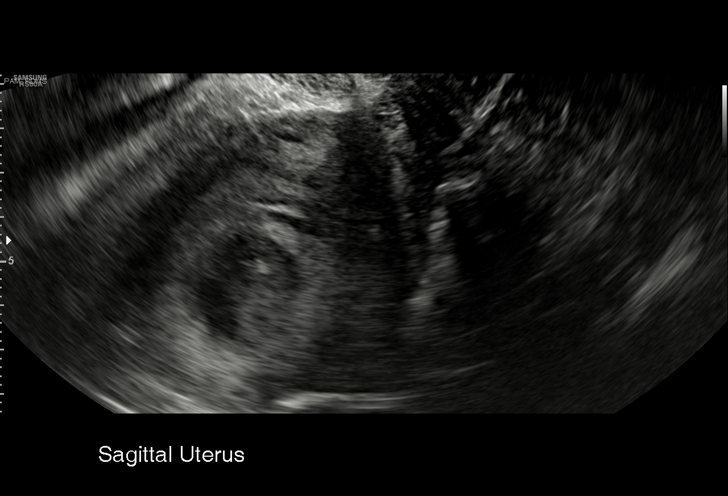

[15 of 27 positions shown; findings below may reference images not displayed]

FINDINGS: Intrauterine gestational sac: Single

Yolk sac:  Visualized.

Embryo:  Visualized.

Cardiac Activity: Visualized.

Heart Rate: 180 bpm

CRL:  20 mm   8 w   4 d                  US EDC: 06/27/2019

Subchorionic hemorrhage:  Small subchorionic hemorrhage is seen.

Maternal uterus/adnexae: Both ovaries are normal in appearance. No
mass or abnormal free fluid identified.
IMPRESSION: Single living IUP measuring 8 weeks 4 days, with US EDC of
06/27/2019.

Small subchorionic hemorrhage noted.

## 2021-02-02 ENCOUNTER — Other Ambulatory Visit (HOSPITAL_COMMUNITY): Payer: Commercial Managed Care - PPO

## 2021-02-05 ENCOUNTER — Ambulatory Visit: Payer: Commercial Managed Care - PPO | Admitting: Adult Health

## 2021-02-20 ENCOUNTER — Other Ambulatory Visit (HOSPITAL_COMMUNITY): Payer: Commercial Managed Care - PPO

## 2021-02-24 ENCOUNTER — Ambulatory Visit: Payer: Commercial Managed Care - PPO | Admitting: Primary Care

## 2021-04-14 ENCOUNTER — Other Ambulatory Visit (HOSPITAL_COMMUNITY): Payer: Commercial Managed Care - PPO

## 2021-04-17 ENCOUNTER — Ambulatory Visit: Payer: Commercial Managed Care - PPO | Admitting: Primary Care

## 2021-10-20 ENCOUNTER — Other Ambulatory Visit: Payer: Self-pay

## 2021-10-20 ENCOUNTER — Ambulatory Visit: Payer: Commercial Managed Care - PPO | Attending: Sports Medicine | Admitting: Physical Therapy

## 2021-10-20 DIAGNOSIS — M25671 Stiffness of right ankle, not elsewhere classified: Secondary | ICD-10-CM | POA: Diagnosis present

## 2021-10-20 DIAGNOSIS — M6281 Muscle weakness (generalized): Secondary | ICD-10-CM | POA: Diagnosis present

## 2021-10-20 DIAGNOSIS — R262 Difficulty in walking, not elsewhere classified: Secondary | ICD-10-CM | POA: Diagnosis present

## 2021-10-20 DIAGNOSIS — R2689 Other abnormalities of gait and mobility: Secondary | ICD-10-CM | POA: Insufficient documentation

## 2021-10-20 DIAGNOSIS — M25571 Pain in right ankle and joints of right foot: Secondary | ICD-10-CM | POA: Insufficient documentation

## 2021-10-20 DIAGNOSIS — R600 Localized edema: Secondary | ICD-10-CM | POA: Diagnosis present

## 2021-10-21 NOTE — Therapy (Addendum)
[facility] [address] [place], [place], [address] Phone: [phone]   Fax:  [phone]  Physical Therapy Evaluation  Patient Details  Name: Debra [name] MRN: [id number] Date of Birth: 1981/08/28 Referring Provider (PT): Rodolph Bong   Encounter Date: 10/20/2021   PT End of Session - 10/20/21 1745     Visit Number 1    Number of Visits 6    Date for PT Re-Evaluation 12/01/21    Authorization Type UHC/UMR    PT Start Time 1700    PT Stop Time 1745    PT Time Calculation (min) 45 min    Activity Tolerance Patient tolerated treatment well    Behavior During Therapy [facility] for tasks assessed/performed             Past Medical History:  Diagnosis Date   Abnormal Pap smear of cervix 01-16-14   -pap nl but Pos.HR HPV/Colpo 02-20-14--LSIL--needs repeat pap 15yr.   Anxiety    in therapy again 10/2017   Hypercholesterolemia 2016   Hypertension    Miscarriage 05/14/2018   Pregnancy induced hypertension     Past Surgical History:  Procedure Laterality Date   CESAREAN SECTION N/A 06/07/2019   Procedure: CESAREAN SECTION;  Surgeon: Candice Camp, MD;  Location: [facility] LD ORS;  Service: Obstetrics;  Laterality: N/A;  Primary edc 8/17 NKDA need RNFA   COLPOSCOPY     ROTATOR CUFF REPAIR Right 07/2017   Dr.Norris--[facility]--bone spur/arthritis   TOE SURGERY Bilateral    -as child--in [place]   TONSILLECTOMY AND ADENOIDECTOMY     -age 68    There were no vitals filed for this visit.    Subjective Assessment - 10/20/21 1700     Subjective Pt reports R heel pain x 5 months. Whenever she stretches her foot back it hurts. It's not consistent pain -- depends on how long her day is. Affects her walking. Pt is supposed to wear a boot but did not do this when she went to the beach last week. She is not sure when she is supposed to stop wearing the boot but has a follow up with Dr. Penni Bombard. Has been taking pain medication which  hasn't been helping too much. Pt states she's tried ice and stretches but did not feel she got too much relief.    Limitations Walking;Sitting;House hold activities    How long can you sit comfortably? n/a    How long can you stand comfortably? ~30 minutes    How long can you walk comfortably? ~5 minutes    Diagnostic tests MRI shows inflammation per pt    Patient Stated Goals to decrease pain    Currently in Pain? Yes   6 or 7/10 at worst   Pain Score 2     Pain Orientation Right    Pain Descriptors / Indicators Aching;Sore    Pain Type Chronic pain    Pain Onset More than a month ago    Pain Frequency Constant    Aggravating Factors  Walking, wrong shoes (least arch support), morning stiffness, sitting long periods    Pain Relieving Factors Shoes with better arch support    Effect of Pain on Daily Activities Difficulty with walking                [facility] PT Assessment - 10/21/21 0001       Assessment   Medical Diagnosis R heel pain    Referring Provider (PT) Leodis Sias  Hand Dominance Right      Precautions   Precautions None      Balance Screen   Has the patient fallen in the past 6 months No      Home Environment   Living Environment Private residence    Living Arrangements Spouse/significant other    Available Help at Discharge Family    Type of Home House    Home Access Level entry      Prior Function   Vocation Full time employment    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    Leisure has 40 year old      Observation/Other Assessments   Focus on Therapeutic Outcomes (FOTO)  59; predicted 74      Functional Tests   Functional tests Other;Single leg stance;Squat      Squat   Comments Increased R foot pronation, unable to DF at ankle      Single Leg Stance   Comments L LE: 24 sec; R LE: 27 sec      Other:   Other/ Comments Pain with weightbearing & heel raises within first 5 reps      AROM   Overall AROM Comments L ankle DF knee towards  wall 25 deg; R knee towards wall 8 deg; ankle motion otherwise grossly WFL      Strength   Overall Strength Comments Bilat ankles grossly 4+/5    Strength Assessment Site Hip;Knee    Right/Left Hip Right;Left    Right Hip Flexion 4+/5    Right Hip Extension 4/5    Right Hip ABduction 5/5    Left Hip Flexion 4+/5    Left Hip Extension 5/5    Left Hip ABduction 5/5    Right/Left Knee Right;Left    Right Knee Flexion 5/5    Right Knee Extension 5/5    Left Knee Flexion 5/5    Left Knee Extension 5/5      Palpation   Palpation comment TTP and taut soleus>gastroc on R, palpation of Achilles tendon and posterior calcaneus. R subtalar joint and talocrural joint hypomobile vs L      Ambulation/Gait   Ambulation Distance (Feet) --   Around gym   Assistive device None    Gait Pattern Step-through pattern;Antalgic;Abducted- right;Wide base of support;Decreased dorsiflexion - right    Ambulation Surface Level;Indoor                        Objective measurements completed on examination: See above findings.       [facility] Adult PT Treatment/Exercise - 10/21/21 0001       Manual Therapy   Manual therapy comments Self calf massage with foam roller      Ankle Exercises: Stretches   Soleus Stretch 30 seconds    Gastroc Stretch 30 seconds    Other Stretch Self ankle mobilization into DF 5x10 sec hold                     PT Education - 10/21/21 0815     Education Details Discussed exam findings, POC, and initial HEP    Person(s) Educated Patient    Methods Explanation;Demonstration;Tactile cues;Verbal cues;Handout    Comprehension Verbalized understanding;Returned demonstration;Verbal cues required;Tactile cues required                 PT Long Term Goals - 10/21/21 0824       PT LONG TERM GOAL #1   Title Pt  will be independent with HEP and management of symptoms    Time 6    Period Weeks    Status New    Target Date 12/02/21      PT LONG  TERM GOAL #2   Title Pt will be able to amb >10 min with reported pain reduced by at least 50%    Time 6    Period Weeks    Status New    Target Date 12/02/21      PT LONG TERM GOAL #3   Title Pt will demo improved R ankle DF = to L    Time 6    Period Weeks    Status New    Target Date 12/02/21      PT LONG TERM GOAL #4   Title Pt will be able to perform functional squat for lifting/carrying activities with no pain    Time 6    Period Weeks    Status New    Target Date 12/02/21                    Plan - 10/21/21 0816     Clinical Impression Statement Mrs. Byron Tipping is a 40 y/o F presenting to OPPT due to complaint of R heel pain. On assessment, pt demos TTP and taut R soleus/gastroc (soleus tighter than gastroc), R<L ankle DF, with point tender pain along heel cord and insertion to posterior calcaneus, and antalgic gait. Pain evident when performing heel raise and while performing increased DF. R>L hip extensor weakness noted as well. S/S appear consistent with Achilles tendonitis with or without bursitis. All these issues are affecting pt's ability to amb comfortably for home and community tasks. Pt would highly benefit from therapy for return to PLOF and reduce her pain.    Personal Factors and Comorbidities Age;Fitness;Time since onset of injury/illness/exacerbation    Examination-Activity Limitations Locomotion Level;Squat;Stairs    Examination-Participation Restrictions Occupation;Yard Work;Community Activity    Stability/Clinical Decision Making Stable/Uncomplicated    Clinical Decision Making Low    Rehab Potential Good    PT Frequency 1x / week    PT Duration 6 weeks    PT Treatment/Interventions ADLs/Self Care Home Management;Cryotherapy;Iontophoresis 4mg /ml Dexamethasone;Electrical Stimulation;Moist Heat;Ultrasound;Therapeutic exercise;Therapeutic activities;Neuromuscular re-education;Patient/family education;Manual techniques;Dry  needling;Taping;Vasopneumatic Device;Gait training;Stair training;Functional mobility training;Balance training;Orthotic Fit/Training;Passive range of motion;Joint Manipulations    PT Next Visit Plan Assess response to HEP. Modify as needed. Stretch gastroc/soleus. Consider manual therapy/TPDN. Work on strengthening PF and DF.    PT Home Exercise Plan Access Code [facility]    Consulted and Agree with Plan of Care Patient             Patient will benefit from skilled therapeutic intervention in order to improve the following deficits and impairments:  Decreased range of motion, Increased muscle spasms, Pain, Impaired flexibility, Postural dysfunction, Increased edema, Decreased strength, Abnormal gait, Difficulty walking, Increased fascial restricitons, Decreased activity tolerance, Decreased balance, Hypomobility, Improper body mechanics, Decreased mobility  Visit Diagnosis: Pain in right ankle and joints of right foot  Stiffness of right ankle, not elsewhere classified  Difficulty in walking, not elsewhere classified  Other abnormalities of gait and mobility  Muscle weakness (generalized)  Localized edema     Problem List Patient Active Problem List   Diagnosis Date Noted   GERD (gastroesophageal reflux disease) 12/12/2020   Upper airway cough syndrome 11/20/2020   Breech presentation of fetus 06/07/2019   Cesarean delivery delivered 06/07/2019   Anxiety 04/12/2018  Pain in joint of right shoulder 11/22/2017   History of arthroscopic procedure on shoulder 11/22/2017    [facility] April Dell Ponto, PT, DPT 10/21/2021, 8:34 AM  [facility] [address] [place], [place], [address] Phone: [phone]   Fax:  [phone]  Name: Debra [name] MRN: [id number] Date of Birth: 27-May-1981

## 2021-10-22 NOTE — Addendum Note (Signed)
Addended by: Jules Husbands MARIE L on: 10/22/2021 12:02 PM   Modules accepted: Orders

## 2021-10-28 ENCOUNTER — Other Ambulatory Visit: Payer: Self-pay

## 2021-10-28 ENCOUNTER — Ambulatory Visit: Payer: Commercial Managed Care - PPO | Admitting: Physical Therapy

## 2021-10-28 ENCOUNTER — Encounter: Payer: Self-pay | Admitting: Physical Therapy

## 2021-10-28 DIAGNOSIS — M25671 Stiffness of right ankle, not elsewhere classified: Secondary | ICD-10-CM

## 2021-10-28 DIAGNOSIS — M25571 Pain in right ankle and joints of right foot: Secondary | ICD-10-CM | POA: Diagnosis not present

## 2021-10-28 DIAGNOSIS — R2689 Other abnormalities of gait and mobility: Secondary | ICD-10-CM

## 2021-10-28 NOTE — Therapy (Signed)
[facility] [address] [place], [place], [address] Phone: [phone]   Fax:  [phone]  Physical Therapy Treatment  Patient Details  Name: Debra [name] MRN: [id number] Date of Birth: [birth year] Referring Provider (PT): Rodolph Bong   Encounter Date: 10/28/2021   PT End of Session - 10/28/21 1646     Visit Number 2    Date for PT Re-Evaluation 12/01/21    Authorization Type UHC/UMR    PT Start Time 1600    PT Stop Time 1640    PT Time Calculation (min) 40 min    Activity Tolerance Patient tolerated treatment well;Patient limited by pain    Behavior During Therapy [facility] for tasks assessed/performed             Past Medical History:  Diagnosis Date   Abnormal Pap smear of cervix 01-16-14   -pap nl but Pos.HR HPV/Colpo 02-20-14--LSIL--needs repeat pap 20yr.   Anxiety    in therapy again 10/2017   Hypercholesterolemia 2016   Hypertension    Miscarriage 05/14/2018   Pregnancy induced hypertension     Past Surgical History:  Procedure Laterality Date   CESAREAN SECTION N/A 06/07/2019   Procedure: CESAREAN SECTION;  Surgeon: Candice Camp, MD;  Location: [facility];  Service: Obstetrics;  Laterality: N/A;  Primary edc 8/17 NKDA need RNFA   COLPOSCOPY     ROTATOR CUFF REPAIR Right 07/2017   Dr.Norris--[facility]--bone spur/arthritis   TOE SURGERY Bilateral    -as child--in [place]   TONSILLECTOMY AND ADENOIDECTOMY     -age 60    There were no vitals filed for this visit.   Subjective Assessment - 10/28/21 1602     Subjective "My heel hurts" Compliant with HEP    Currently in Pain? Yes    Pain Score 4     Pain Location Heel    Pain Orientation Right                               [facility] Adult PT Treatment/Exercise - 10/28/21 0001       Exercises   Exercises --      Modalities   Modalities Iontophoresis      Iontophoresis   Type of Iontophoresis Dexamethasone    Location R  achillies    Dose 30mL    Time 4 hour patch      Manual Therapy   Manual Therapy Soft tissue mobilization    Soft tissue mobilization R calf and soleus      Ankle Exercises: Stretches   Gastroc Stretch 3 reps;10 seconds      Ankle Exercises: Aerobic   Recumbent Bike L3 x 6 min              Trigger Point Dry Needling - 10/28/21 0001     Consent Given? Yes    Education Handout Provided Yes    Muscles Treated Lower Quadrant Gastrocnemius    Gastrocnemius Response Twitch response elicited;Palpable increased muscle length                        PT Long Term Goals - 10/21/21 0824       PT LONG TERM GOAL #1   Title Pt will be independent with HEP and management of symptoms    Time 6    Period Weeks    Status New    Target Date 12/02/21  PT LONG TERM GOAL #2   Title Pt will be able to amb >10 min with reported pain reduced by at least 50%    Time 6    Period Weeks    Status New    Target Date 12/02/21      PT LONG TERM GOAL #3   Title Pt will demo improved R ankle DF = to L    Time 6    Period Weeks    Status New    Target Date 12/02/21      PT LONG TERM GOAL #4   Title Pt will be able to perform functional squat for lifting/carrying activities with no pain    Time 6    Period Weeks    Status New    Target Date 12/02/21                   Plan - 10/28/21 1646     Clinical Impression Statement Pt enters clinic reporting increase R achilles pain with ambulation and DF. Increase pain reported with soleus and gastroc stretching. R soleus and gastroc tightness with TP noted with STM. R achilles pain with palpation. Lead PT assisted in tx with DN. Ionto to help with pain.    Personal Factors and Comorbidities Age;Fitness;Time since onset of injury/illness/exacerbation    Examination-Activity Limitations Locomotion Level;Squat;Stairs    Examination-Participation Restrictions Occupation;Yard Work;Community Activity    Stability/Clinical  Decision Making Stable/Uncomplicated    Rehab Potential Good    PT Frequency 1x / week    PT Duration 6 weeks    PT Treatment/Interventions ADLs/Self Care Home Management;Cryotherapy;Iontophoresis 4mg /ml Dexamethasone;Electrical Stimulation;Moist Heat;Ultrasound;Therapeutic exercise;Therapeutic activities;Neuromuscular re-education;Patient/family education;Manual techniques;Dry needling;Taping;Vasopneumatic Device;Gait training;Stair training;Functional mobility training;Balance training;Orthotic Fit/Training;Passive range of motion;Joint Manipulations    PT Next Visit Plan Assess response to HEP. Modify as needed. Stretch gastroc/soleus. Consider manual therapy/TPDN. Work on strengthening PF and DF.             Patient will benefit from skilled therapeutic intervention in order to improve the following deficits and impairments:  Decreased range of motion, Increased muscle spasms, Pain, Impaired flexibility, Postural dysfunction, Increased edema, Decreased strength, Abnormal gait, Difficulty walking, Increased fascial restricitons, Decreased activity tolerance, Decreased balance, Hypomobility, Improper body mechanics, Decreased mobility  Visit Diagnosis: Stiffness of right ankle, not elsewhere classified  Pain in right ankle and joints of right foot  Other abnormalities of gait and mobility     Problem List Patient Active Problem List   Diagnosis Date Noted   GERD (gastroesophageal reflux disease) 12/12/2020   Upper airway cough syndrome 11/20/2020   Breech presentation of fetus 06/07/2019   Cesarean delivery delivered 06/07/2019   Anxiety 04/12/2018   Pain in joint of right shoulder 11/22/2017   History of arthroscopic procedure on shoulder 11/22/2017    11/24/2017, PTA 10/28/2021, 4:51 PM  [facility] [address] [place], [place], [place] Phone: [phone]   Fax:  [phone]  Name: Debra [name] MRN:  Garen Lah Date of Birth: 11-29-1980

## 2021-10-28 NOTE — Patient Instructions (Signed)

## 2021-11-05 ENCOUNTER — Ambulatory Visit: Payer: Commercial Managed Care - PPO | Admitting: Physical Therapy

## 2021-11-05 ENCOUNTER — Other Ambulatory Visit: Payer: Self-pay

## 2021-11-05 DIAGNOSIS — R262 Difficulty in walking, not elsewhere classified: Secondary | ICD-10-CM

## 2021-11-05 DIAGNOSIS — M25671 Stiffness of right ankle, not elsewhere classified: Secondary | ICD-10-CM

## 2021-11-05 DIAGNOSIS — M25571 Pain in right ankle and joints of right foot: Secondary | ICD-10-CM | POA: Diagnosis not present

## 2021-11-05 NOTE — Therapy (Signed)
[facility] [address] [place], [place], [address] Phone: [phone]   Fax:  [phone]  Physical Therapy Treatment  Patient Details  Name: Debra [name] MRN: [id number] Date of Birth: 1981-01-16 Referring Provider (PT): Rodolph Bong   Encounter Date: 11/05/2021   PT End of Session - 11/05/21 1700     Visit Number 3    Number of Visits 6    Date for PT Re-Evaluation 12/01/21    Authorization Type UHC/UMR    PT Start Time 1615    PT Stop Time 1700    PT Time Calculation (min) 45 min             Past Medical History:  Diagnosis Date   Abnormal Pap smear of cervix 01-16-14   -pap nl but Pos.HR HPV/Colpo 02-20-14--LSIL--needs repeat pap 44yr.   Anxiety    in therapy again 10/2017   Hypercholesterolemia 2016   Hypertension    Miscarriage 05/14/2018   Pregnancy induced hypertension     Past Surgical History:  Procedure Laterality Date   CESAREAN SECTION N/A 06/07/2019   Procedure: CESAREAN SECTION;  Surgeon: Candice Camp, MD;  Location: [facility];  Service: Obstetrics;  Laterality: N/A;  Primary edc 8/17 NKDA need RNFA   COLPOSCOPY     ROTATOR CUFF REPAIR Right 07/2017   Dr.Norris--[facility]--bone spur/arthritis   TOE SURGERY Bilateral    -as child--in [place]   TONSILLECTOMY AND ADENOIDECTOMY     -age 40    There were no vitals filed for this visit.   Subjective Assessment - 11/05/21 1618     Subjective we did alot of things last tx that helped. mb in with wide BOS and on lateral side of left foot. MD says heel spur- script for nitro patch    Currently in Pain? Yes    Pain Location Heel    Pain Orientation Right                               [facility] Adult PT Treatment/Exercise - 11/05/21 0001       Exercises   Exercises Knee/Hip      Knee/Hip Exercises: Aerobic   Nustep L5 5 min LE only      Knee/Hip Exercises: Standing   Heel Raises Both;15 reps   black bar plus toe  raises   Other Standing Knee Exercises gastroc and soleou stretch on incline board   PROSTRETCH     Knee/Hip Exercises: Seated   Other Seated Knee/Hip Exercises ankle 4 way with red tband 15 x      Modalities   Modalities Iontophoresis      Iontophoresis   Type of Iontophoresis Dexamethasone    Location R achillies    Dose 52mL    Time 4 hour patch      Manual Therapy   Manual Therapy Soft tissue mobilization    Manual therapy comments very tender esp medially    Soft tissue mobilization R calf and soleus              Trigger Point Dry Needling - 11/05/21 0001     Consent Given? Yes    Gastrocnemius Response Twitch response elicited;Palpable increased muscle length                        PT Long Term Goals - 11/05/21 1659       PT  LONG TERM GOAL #1   Title Pt will be independent with HEP and management of symptoms    Status On-going                   Plan - 11/05/21 1700     Clinical Impression Statement progress some ex and stretching,cuing needed and pt did have pain. MT to gastroc and soleus and medial side tightest, DN and ionto as pt felt very helpful. pt did just get script for nitro patch for achilles so will need to assess use of ionto next session.    PT Treatment/Interventions ADLs/Self Care Home Management;Cryotherapy;Iontophoresis 4mg /ml Dexamethasone;Electrical Stimulation;Moist Heat;Ultrasound;Therapeutic exercise;Therapeutic activities;Neuromuscular re-education;Patient/family education;Manual techniques;Dry needling;Taping;Vasopneumatic Device;Gait training;Stair training;Functional mobility training;Balance training;Orthotic Fit/Training;Passive range of motion;Joint Manipulations    PT Next Visit Plan assess and progress             Patient will benefit from skilled therapeutic intervention in order to improve the following deficits and impairments:  Decreased range of motion, Increased muscle spasms, Pain, Impaired  flexibility, Postural dysfunction, Increased edema, Decreased strength, Abnormal gait, Difficulty walking, Increased fascial restricitons, Decreased activity tolerance, Decreased balance, Hypomobility, Improper body mechanics, Decreased mobility  Visit Diagnosis: Stiffness of right ankle, not elsewhere classified  Pain in right ankle and joints of right foot  Difficulty in walking, not elsewhere classified     Problem List Patient Active Problem List   Diagnosis Date Noted   GERD (gastroesophageal reflux disease) 12/12/2020   Upper airway cough syndrome 11/20/2020   Breech presentation of fetus 06/07/2019   Cesarean delivery delivered 06/07/2019   Anxiety 04/12/2018   Pain in joint of right shoulder 11/22/2017   History of arthroscopic procedure on shoulder 11/22/2017    Artemis Loyal,ANGIE, PTA 11/05/2021, 5:03 PM  [facility] [address] [place], [place], [place] Phone: [phone]   Fax:  [phone]  Name: Debra [name] MRN: Garen Lah Date of Birth: [birth year]

## 2021-11-11 ENCOUNTER — Ambulatory Visit: Payer: Commercial Managed Care - PPO | Attending: Sports Medicine | Admitting: Physical Therapy

## 2021-11-11 ENCOUNTER — Other Ambulatory Visit: Payer: Self-pay

## 2021-11-11 ENCOUNTER — Encounter: Payer: Self-pay | Admitting: Physical Therapy

## 2021-11-11 DIAGNOSIS — M25571 Pain in right ankle and joints of right foot: Secondary | ICD-10-CM | POA: Diagnosis present

## 2021-11-11 DIAGNOSIS — M25671 Stiffness of right ankle, not elsewhere classified: Secondary | ICD-10-CM | POA: Diagnosis present

## 2021-11-11 DIAGNOSIS — R2689 Other abnormalities of gait and mobility: Secondary | ICD-10-CM | POA: Insufficient documentation

## 2021-11-11 DIAGNOSIS — R262 Difficulty in walking, not elsewhere classified: Secondary | ICD-10-CM | POA: Insufficient documentation

## 2021-11-11 NOTE — Therapy (Addendum)
[place]. [place], [place], [address] Phone: [phone]   Fax:  [phone]  Physical Therapy Treatment  Patient Details  Name: Tanajia Bramlet MRN: [id number] Date of Birth: July 24, 1981 Referring Provider (PT): Vickki Hearing   Encounter Date: 11/11/2021   PT End of Session - 11/11/21 1645     Visit Number 4    Date for PT Re-Evaluation 12/01/21    Authorization Type UHC/UMR    PT Start Time 1600    PT Stop Time 1645    PT Time Calculation (min) 45 min    Activity Tolerance Patient tolerated treatment well    Behavior During Therapy [facility] for tasks assessed/performed             Past Medical History:  Diagnosis Date   Abnormal Pap smear of cervix 01-16-14   -pap nl but Pos.HR HPV/Colpo 02-20-14--LSIL--needs repeat pap 40yr.   Anxiety    in therapy again 10/2017   Hypercholesterolemia 2016   Hypertension    Miscarriage 05/14/2018   Pregnancy induced hypertension     Past Surgical History:  Procedure Laterality Date   CESAREAN SECTION N/A 06/07/2019   Procedure: CESAREAN SECTION;  Surgeon: Louretta Shorten, MD;  Location: [place] LD ORS;  Service: Obstetrics;  Laterality: N/A;  Primary edc 8/17 NKDA need RNFA   COLPOSCOPY     ROTATOR CUFF REPAIR Right 07/2017   Dr.Norris--[facility]--bone spur/arthritis   TOE SURGERY Bilateral    -as child--in [place]   TONSILLECTOMY AND ADENOIDECTOMY     -age 8    There were no vitals filed for this visit.   Subjective Assessment - 11/11/21 1604     Subjective Sunday was not a good day, chasing her daughter around indoor playground. "Today is kinda blah"    Currently in Pain? Yes    Pain Score 3     Pain Location Ankle    Pain Orientation Right                               [facility] Adult PT Treatment/Exercise - 11/11/21 0001       Knee/Hip Exercises: Stretches   Gastroc Stretch Both;3 reps;20 seconds;10 seconds   Level 1 slant board   Soleus  Stretch Both;3 reps;10 seconds;20 seconds      Knee/Hip Exercises: Aerobic   Nustep L5 5 min LE only      Knee/Hip Exercises: Seated   Other Seated Knee/Hip Exercises ankle 4 way with red tband 15 x      Iontophoresis   Type of Iontophoresis Dexamethasone    Location R achillies    Dose 58mL    Time 4 hour patch      Manual Therapy   Manual Therapy Soft tissue mobilization    Soft tissue mobilization R calf and soleus              Trigger Point Dry Needling - 11/11/21 0001     Consent Given? Yes    Gastrocnemius Response Twitch response elicited;Palpable increased muscle length                        PT Long Term Goals - 11/05/21 1659       PT LONG TERM GOAL #1   Title Pt will be independent with HEP and management of symptoms    Status On-going  Plan - 11/11/21 1645     Clinical Impression Statement Session consisted of stretching and STM to R calf. Some tenderness in the R gastroc noted with STM. Good strength with 4 way ankle tband strengthening. PT assisted in treatment session performing DN to R gastroc. Ionto for pain    Personal Factors and Comorbidities Age;Fitness;Time since onset of injury/illness/exacerbation    Examination-Activity Limitations Locomotion Level;Squat;Stairs    Examination-Participation Restrictions Occupation;Yard Work;Community Activity    Stability/Clinical Decision Making Stable/Uncomplicated    Rehab Potential Good    PT Frequency 1x / week    PT Duration 6 weeks    PT Next Visit Plan assess and progress             Patient will benefit from skilled therapeutic intervention in order to improve the following deficits and impairments:  Decreased range of motion, Increased muscle spasms, Pain, Impaired flexibility, Postural dysfunction, Increased edema, Decreased strength, Abnormal gait, Difficulty walking, Increased fascial restricitons, Decreased activity tolerance, Decreased balance,  Hypomobility, Improper body mechanics, Decreased mobility  Visit Diagnosis: Stiffness of right ankle, not elsewhere classified  Pain in right ankle and joints of right foot  Difficulty in walking, not elsewhere classified     Problem List Patient Active Problem List   Diagnosis Date Noted   GERD (gastroesophageal reflux disease) 12/12/2020   Upper airway cough syndrome 11/20/2020   Breech presentation of fetus 06/07/2019   Cesarean delivery delivered 06/07/2019   Anxiety 04/12/2018   Pain in joint of right shoulder 11/22/2017   History of arthroscopic procedure on shoulder 11/22/2017    Sumner Boast, PT 11/11/2021, 4:54 PM  [place]. [place], [place], [address] Phone: [phone]   Fax:  [phone]  Name: Shermeka Klugh MRN: [id number] Date of Birth: 12/29/1980

## 2021-11-17 ENCOUNTER — Ambulatory Visit: Payer: Commercial Managed Care - PPO | Admitting: Physical Therapy

## 2021-11-17 ENCOUNTER — Encounter: Payer: Self-pay | Admitting: Physical Therapy

## 2021-11-17 ENCOUNTER — Other Ambulatory Visit: Payer: Self-pay

## 2021-11-17 DIAGNOSIS — M25571 Pain in right ankle and joints of right foot: Secondary | ICD-10-CM

## 2021-11-17 DIAGNOSIS — R262 Difficulty in walking, not elsewhere classified: Secondary | ICD-10-CM

## 2021-11-17 DIAGNOSIS — R2689 Other abnormalities of gait and mobility: Secondary | ICD-10-CM

## 2021-11-17 DIAGNOSIS — M25671 Stiffness of right ankle, not elsewhere classified: Secondary | ICD-10-CM | POA: Diagnosis not present

## 2021-11-17 NOTE — Therapy (Signed)
[place] [facility] °[address] °[place], [place], [address] °Phone: [phone]   Fax:  [phone] ° °Physical Therapy Treatment ° °Patient Details  °Name: Debra [name] °MRN: [id number] °Date of Birth: 04/23/1981 °Referring Provider (PT): Kendall, Adam ° ° °Encounter Date: 11/17/2021 ° ° PT End of Session - 11/17/21 1639   ° ° Date for PT Re-Evaluation 12/01/21   ° Authorization Type UHC/UMR   ° PT Start Time 1559   ° PT Stop Time 1639   ° PT Time Calculation (min) 40 min   ° Activity Tolerance Patient tolerated treatment well   ° Behavior During Therapy [facility] for tasks assessed/performed   ° °  °  ° °  ° ° °Past Medical History:  °Diagnosis Date  ° Abnormal Pap smear of cervix 01-16-14  ° -pap nl but Pos.HR HPV/Colpo 02-20-14--LSIL--needs repeat pap 1yr.  ° Anxiety   ° in therapy again 10/2017  ° Hypercholesterolemia 2016  ° Hypertension   ° Miscarriage 05/14/2018  ° Pregnancy induced hypertension   ° ° °Past Surgical History:  °Procedure Laterality Date  ° CESAREAN SECTION N/A 06/07/2019  ° Procedure: CESAREAN SECTION;  Surgeon: Lowe, David, MD;  [facility];  Service: Obstetrics;  Laterality: N/A;  Primary °edc 8/17 °NKDA °need RNFA  ° COLPOSCOPY    ° ROTATOR CUFF REPAIR Right 07/2017  ° Dr.Norris--[facility]--bone spur/arthritis  ° TOE SURGERY Bilateral   ° -as child--in [place]  ° TONSILLECTOMY AND ADENOIDECTOMY    ° -age 41  ° ° °There were no vitals filed for this visit. ° ° Subjective Assessment - 11/17/21 1600   ° ° Subjective "Im ok" Friday was kinda bad. Today has not noticed it at all   ° Currently in Pain? No/denies   ° °  °  ° °  ° ° ° ° ° ° ° ° ° ° ° ° ° ° ° ° ° ° ° ° [facility] Adult PT Treatment/Exercise - 11/17/21 0001   ° °  ° Exercises  ° Exercises Knee/Hip;Ankle   °  ° Knee/Hip Exercises: Stretches  ° Gastroc Stretch Both;4 reps;10 seconds;20 seconds   ° Soleus Stretch Both;4 reps;10 seconds;20 seconds   °  ° Knee/Hip Exercises: Aerobic  ° Nustep L5  5 min LE only   °  ° Knee/Hip Exercises: Seated  ° Other Seated Knee/Hip Exercises ankle 4 way with green tband 15 x   °  ° Iontophoresis  ° Type of Iontophoresis Dexamethasone   ° Location R achillies   ° Dose 1mL   ° Time 4 hour patch   °  ° Manual Therapy  ° Manual Therapy Soft tissue mobilization   ° Soft tissue mobilization R calf and soleus   °  ° Ankle Exercises: Standing  ° Other Standing Ankle Exercises Fitter first AROM all directions   ° °  °  ° °  ° ° ° ° ° ° ° ° ° ° ° ° ° ° ° PT Long Term Goals - 11/17/21 1642   ° °  ° PT LONG TERM GOAL #1  ° Title Pt will be independent with HEP and management of symptoms   ° Status Partially Met   °  ° PT LONG TERM GOAL #2  ° Title Pt will be able to amb >10 min with reported pain reduced by at least 50%   ° Status Partially Met   °  ° PT LONG TERM GOAL #3  °   Title Pt will demo improved R ankle DF = to L   ° Status On-going   °  ° PT LONG TERM GOAL #4  ° Title Pt will be able to perform functional squat for lifting/carrying activities with no pain   ° Status Partially Met   ° °  °  ° °  ° ° ° ° ° ° ° ° Plan - 11/17/21 1639   ° ° Clinical Impression Statement Pt has improved some reporting decrease R achilles pain. Continues to Gastroc STM and stretching to decrease tension R heel cord. She continues to respond well to ionto   ° Personal Factors and Comorbidities Age;Fitness;Time since onset of injury/illness/exacerbation   ° Examination-Activity Limitations Locomotion Level;Squat;Stairs   ° Examination-Participation Restrictions Occupation;Yard Work;Community Activity   ° Stability/Clinical Decision Making Stable/Uncomplicated   ° Rehab Potential Good   ° PT Frequency 1x / week   ° PT Duration 6 weeks   ° PT Treatment/Interventions ADLs/Self Care Home Management;Cryotherapy;Iontophoresis 4mg/ml Dexamethasone;Electrical Stimulation;Moist Heat;Ultrasound;Therapeutic exercise;Therapeutic activities;Neuromuscular re-education;Patient/family education;Manual techniques;Dry  needling;Taping;Vasopneumatic Device;Gait training;Stair training;Functional mobility training;Balance training;Orthotic Fit/Training;Passive range of motion;Joint Manipulations   ° PT Next Visit Plan assess and progress   ° °  °  ° °  ° ° °Patient will benefit from skilled therapeutic intervention in order to improve the following deficits and impairments:  Decreased range of motion, Increased muscle spasms, Pain, Impaired flexibility, Postural dysfunction, Increased edema, Decreased strength, Abnormal gait, Difficulty walking, Increased fascial restricitons, Decreased activity tolerance, Decreased balance, Hypomobility, Improper body mechanics, Decreased mobility ° °Visit Diagnosis: °Stiffness of right ankle, not elsewhere classified ° °Pain in right ankle and joints of right foot ° °Difficulty in walking, not elsewhere classified ° °Other abnormalities of gait and mobility ° ° ° ° °Problem List °Patient Active Problem List  ° Diagnosis Date Noted  ° GERD (gastroesophageal reflux disease) 12/12/2020  ° Upper airway cough syndrome 11/20/2020  ° Breech presentation of fetus 06/07/2019  ° Cesarean delivery delivered 06/07/2019  ° Anxiety 04/12/2018  ° Pain in joint of right shoulder 11/22/2017  ° History of arthroscopic procedure on shoulder 11/22/2017  ° ° °Ronald G Pemberton, PTA °11/17/2021, 4:43 PM ° °[place] °[facility] °[address] °[place], [place], [address] °Phone: [phone]   Fax:  [phone] ° °Name: Ralphine Vollmer °MRN: [id number] °Date of Birth: 03/03/1981 ° ° ° °

## 2021-12-15 ENCOUNTER — Encounter: Payer: Self-pay | Admitting: Primary Care

## 2021-12-15 ENCOUNTER — Other Ambulatory Visit: Payer: Self-pay

## 2021-12-15 ENCOUNTER — Ambulatory Visit (INDEPENDENT_AMBULATORY_CARE_PROVIDER_SITE_OTHER): Payer: Commercial Managed Care - PPO | Admitting: Primary Care

## 2021-12-15 VITALS — BP 132/86 | HR 98 | Temp 97.7°F | Ht 63.0 in | Wt 297.8 lb

## 2021-12-15 DIAGNOSIS — J4520 Mild intermittent asthma, uncomplicated: Secondary | ICD-10-CM

## 2021-12-15 DIAGNOSIS — R058 Other specified cough: Secondary | ICD-10-CM

## 2021-12-15 DIAGNOSIS — K219 Gastro-esophageal reflux disease without esophagitis: Secondary | ICD-10-CM | POA: Diagnosis not present

## 2021-12-15 LAB — NITRIC OXIDE: FeNO level (ppb): 12

## 2021-12-15 MED ORDER — ARNUITY ELLIPTA 100 MCG/ACT IN AEPB: 1.0000 | INHALATION_SPRAY | Freq: Every day | RESPIRATORY_TRACT | 3 refills | Status: DC

## 2021-12-15 NOTE — Progress Notes (Signed)
@Patient  ID: , female    DOB: 16-Mar-1981, 41 y.o.   MRN: [id number]  Chief Complaint  Patient presents with   Follow-up    Patient has had a cough for 6 weeks.     Referring provider: [id number], PA  HPI: 41 year old female, former smoker. PMH significant upper airway cough, GERD. Former patient of Dr. 41, last seen by pulmonary NP 12/12/20.   12/15/2021 Patient presents today for 1 year follow-up/acute visit. She has had a cough for 6 weeks, started in December 2022. She went to UC and was treated with prednisone which helped some but cough returned shortly after completing. She has been taking Zyrtec and Pepcid regularly. Thinks there may be "something" triggering her symptoms coming from air ducks in her home, getting this check out next week. She is originally from [place], has been her for about 7 years. She believes her dad had hyperreactive airways disease. Denies f/c/s, sob, chest tightness or wheezing.    No Known Allergies  Immunization History  Administered Date(s) Administered   Influenza Inj Mdck Quad Pf 07/29/2018   Influenza Inj Mdck Quad With Preservative 10/18/2017   Influenza,inj,Quad PF,6+ Mos 07/09/2020   Influenza,inj,Quad PF,6-35 Mos 07/16/2019   Influenza-Unspecified 10/18/2017, 07/09/2021   PFIZER(Purple Top)SARS-COV-2 Vaccination 01/19/2020, 02/20/2020, 09/15/2020   Tdap 03/14/2019, 04/14/2019    Past Medical History:  Diagnosis Date   Abnormal Pap smear of cervix 01-16-14   -pap nl but Pos.HR HPV/Colpo 02-20-14--LSIL--needs repeat pap 20yr.   Anxiety    in therapy again 10/2017   Hypercholesterolemia 2016   Hypertension    Miscarriage 05/14/2018   Pregnancy induced hypertension     Tobacco History: Social History   Tobacco Use  Smoking Status Former   Packs/day: 0.25   Types: Cigarettes   Quit date: 05/14/2009   Years since quitting: 12.6  Smokeless Tobacco Never  Tobacco Comments   2-3 a day, 1 pack per week    Counseling given: Not Answered Tobacco comments: 2-3 a day, 1 pack per week   Outpatient Medications Prior to Visit  Medication Sig Dispense Refill   albuterol (VENTOLIN HFA) 108 (90 Base) MCG/ACT inhaler Inhale 2 puffs into the lungs as needed.     benzonatate (TESSALON) 100 MG capsule Take 100 mg by mouth 3 (three) times daily.     benzonatate (TESSALON) 100 MG capsule benzonatate 100 mg capsule  TAKE 1 TO 2 CAPSULES BY MOUTH EVERY 8 TO 12 HOURS     famotidine (PEPCID) 10 MG tablet Take 10 mg by mouth daily.     hydrochlorothiazide (MICROZIDE) 12.5 MG capsule Take 12.5 mg by mouth daily.     sertraline (ZOLOFT) 25 MG tablet Take 25 mg by mouth daily.     gabapentin (NEURONTIN) 100 MG capsule Take 100 mg by mouth 3 (three) times daily. (Patient not taking: Reported on 12/15/2021)     No facility-administered medications prior to visit.   Review of Systems  Review of Systems  Constitutional: Negative.   HENT:  Positive for postnasal drip.   Respiratory:  Positive for cough. Negative for chest tightness, shortness of breath and wheezing.     Physical Exam  BP 132/86 (BP Location: Right Arm, Patient Position: Sitting, Cuff Size: Normal)    Pulse 98    Temp 97.7 F (36.5 C) (Oral)    Ht 5\' 3"  (1.6 m)    Wt 297 lb 12.8 oz (135.1 kg)    SpO2 97%  BMI 52.75 kg/m  Physical Exam Constitutional:      General: She is not in acute distress.    Appearance: Normal appearance. She is not ill-appearing.  HENT:     Head: Normocephalic and atraumatic.     Mouth/Throat:     Mouth: Mucous membranes are moist.     Pharynx: Oropharynx is clear.  Cardiovascular:     Rate and Rhythm: Normal rate and regular rhythm.  Pulmonary:     Effort: Pulmonary effort is normal.     Breath sounds: Normal breath sounds. No wheezing, rhonchi or rales.  Musculoskeletal:        General: Normal range of motion.     Cervical back: Normal range of motion and neck supple.  Skin:    General: Skin is warm and  dry.  Neurological:     General: No focal deficit present.     Mental Status: She is alert and oriented to person, place, and time. Mental status is at baseline.  Psychiatric:        Mood and Affect: Mood normal.        Behavior: Behavior normal.        Thought Content: Thought content normal.        Judgment: Judgment normal.     Lab Results:  CBC    Component Value Date/Time   WBC 16.4 (H) 11/18/2020 1705   RBC 5.01 11/18/2020 1705   HGB 14.7 11/18/2020 1705   HCT 43.4 11/18/2020 1705   PLT 430.0 (H) 11/18/2020 1705   MCV 86.6 11/18/2020 1705   MCH 30.4 06/08/2019 0535   MCHC 33.8 11/18/2020 1705   RDW 14.3 11/18/2020 1705   LYMPHSABS 4.1 (H) 11/18/2020 1705   MONOABS 0.9 11/18/2020 1705   EOSABS 0.5 11/18/2020 1705   BASOSABS 0.1 11/18/2020 1705    BMET    Component Value Date/Time   NA 137 06/07/2019 1024   K 4.1 06/07/2019 1024   CL 105 06/07/2019 1024   CO2 23 06/07/2019 1024   GLUCOSE 109 (H) 06/07/2019 1024   BUN 6 06/07/2019 1024   CREATININE 0.61 06/07/2019 1024   CALCIUM 8.5 (L) 06/07/2019 1024   GFRNONAA >60 06/07/2019 1024   GFRAA >60 06/07/2019 1024    BNP No results found for: BNP  ProBNP No results found for: PROBNP  Imaging: No results found.   Assessment & Plan:   Mild intermittent asthma - Symptoms consistently with mild intermittent asthma, triggered by recent viral URI. Cough temporarily improved with oral steroids. Lungs were clear on exam. FENO was normal. Recommend resuming Arnuity take one puff daily in the morning. Continue Zyrtec 10mg  daily and add Flonase nasal spray as needed for nasal congestion. Needs to schedule pulmonary function testing and follow-up in 4-6 weeks.   GERD (gastroesophageal reflux disease) - Continue Pepcid AC and GERD diet   , NP 12/17/2021

## 2021-12-15 NOTE — Patient Instructions (Addendum)
Recommendations: - Start Arnuity - take one puff daily in the morning (rinse mouth after use- given 3 refills) - Continue Zyrtec 10mg  daily - Add Flonase nasal spray to use as needed for nasal congestion - Continue Pepcid AC   Orders: - FENO re: cough   Follow-up: - 4-6 weeks with PFTs prior / Beth NP

## 2021-12-17 DIAGNOSIS — J45991 Cough variant asthma: Secondary | ICD-10-CM | POA: Insufficient documentation

## 2021-12-17 DIAGNOSIS — J4520 Mild intermittent asthma, uncomplicated: Secondary | ICD-10-CM | POA: Insufficient documentation

## 2021-12-17 NOTE — Assessment & Plan Note (Addendum)
-   Continue Pepcid AC and GERD diet

## 2021-12-17 NOTE — Assessment & Plan Note (Addendum)
-   Symptoms consistently with mild intermittent asthma, triggered by recent viral URI. Cough temporarily improved with oral steroids. Lungs were clear on exam. FENO was normal. Recommend resuming Arnuity 154mcg take one puff daily in the morning. Continue Zyrtec 10mg  daily and add Flonase nasal spray as needed for nasal congestion. Needs to schedule pulmonary function testing and follow-up in 4-6 weeks.

## 2022-01-21 ENCOUNTER — Ambulatory Visit: Payer: Commercial Managed Care - PPO | Admitting: Primary Care

## 2022-02-09 ENCOUNTER — Ambulatory Visit
Admission: RE | Admit: 2022-02-09 | Discharge: 2022-02-09 | Disposition: A | Discharge status: Home / Self Care | Payer: Commercial Managed Care - PPO | Source: Ambulatory Visit | Attending: Emergency Medicine | Admitting: Emergency Medicine

## 2022-02-09 VITALS — BP 124/87 | HR 99 | Temp 98.7°F | Resp 20

## 2022-02-09 DIAGNOSIS — J4531 Mild persistent asthma with (acute) exacerbation: Secondary | ICD-10-CM | POA: Diagnosis not present

## 2022-02-09 DIAGNOSIS — J209 Acute bronchitis, unspecified: Secondary | ICD-10-CM | POA: Diagnosis not present

## 2022-02-09 DIAGNOSIS — J329 Chronic sinusitis, unspecified: Secondary | ICD-10-CM

## 2022-02-09 DIAGNOSIS — J310 Chronic rhinitis: Secondary | ICD-10-CM | POA: Diagnosis not present

## 2022-02-09 DIAGNOSIS — H66002 Acute suppurative otitis media without spontaneous rupture of ear drum, left ear: Secondary | ICD-10-CM | POA: Diagnosis not present

## 2022-02-09 MED ORDER — FLUTICASONE PROPIONATE 50 MCG/ACT NA SUSP: 1.0000 | Freq: Every day | NASAL | 1 refills | Status: AC

## 2022-02-09 MED ORDER — ALBUTEROL SULFATE HFA 108 (90 BASE) MCG/ACT IN AERS: 2.0000 | INHALATION_SPRAY | Freq: Four times a day (QID) | RESPIRATORY_TRACT | 2 refills | Status: DC | PRN

## 2022-02-09 MED ORDER — FEXOFENADINE HCL 180 MG PO TABS: 180.0000 mg | ORAL_TABLET | Freq: Every day | ORAL | 1 refills | Status: AC

## 2022-02-09 MED ORDER — IBUPROFEN 400 MG PO TABS: 400.0000 mg | ORAL_TABLET | Freq: Three times a day (TID) | ORAL | 0 refills | Status: DC | PRN

## 2022-02-09 MED ORDER — FLUCONAZOLE 150 MG PO TABS: ORAL_TABLET | ORAL | 0 refills | Status: DC

## 2022-02-09 MED ORDER — TRELEGY ELLIPTA 200-62.5-25 MCG/ACT IN AEPB: 1.0000 | INHALATION_SPRAY | Freq: Every morning | RESPIRATORY_TRACT | 0 refills | Status: DC

## 2022-02-09 MED ORDER — GUAIFENESIN 400 MG PO TABS: ORAL_TABLET | ORAL | 0 refills | Status: DC

## 2022-02-09 MED ORDER — PROMETHAZINE-DM 6.25-15 MG/5ML PO SYRP: 5.0000 mL | ORAL_SOLUTION | Freq: Four times a day (QID) | ORAL | 0 refills | Status: DC | PRN

## 2022-02-09 MED ORDER — AEROCHAMBER PLUS FLO-VU LARGE MISC: 1.0000 | Freq: Once | 0 refills | Status: AC

## 2022-02-09 NOTE — ED Triage Notes (Signed)
Pt was seen at another urgent care on Sunday and given Augmentin for ear infection, pt feeling worse, pain is in left ear and also has cough ?

## 2022-02-09 NOTE — ED Provider Notes (Signed)
?[facility] ? ? ? ?CSN: [id number] ?Arrival date & time: 02/09/22  0948 ?  ? ?HISTORY  ? ?Chief Complaint  ?Patient presents with  ? Cough  ?  Diagnosed ear infection, fever after three days of antibiotics, dizzy, coughing, continued ear pain - Entered by patient  ? Otalgia  ? Fever  ? ?HPI ?Debra [name] is a 41 y.o. female. Pt was seen at another urgent care on Sunday and given Augmentin for ear infection patient states she took 2 doses of Augmentin on Sunday and 2 on Monday but did not take it this morning because every time she coughs, she has worsening pain in her left ear.  Patient states the cough is new since she began the Augmentin.  Patient states has had coughs that are similar in the past, currently taking Arnuity every day but states she does not have any albuterol at this time even though it has been prescribed for her in the past.  Patient states she took [employer] last night with no improvement of her cough.  Patient states she is also had a significant amount of nasal congestion and runny nose.  Patient denies known fever, vital signs are patient provided with Trelegy for the next 14 days, she has been advised to hold Arnuity while using this.  Patient provided with a renewal of her albuterol inhaler and advised to use it 4 times daily.  Patient provided with allergy medications as well as Mucinex for daytime cough and Promethazine DM for nighttime cough.  Patient provided with a prescription for vaginal yeast infection medication, Diflucan, should she develop symptoms of vaginal yeast infection after exposure to Augmentin which I recommend she complete.  Patient provided patient also provided with a renewal of her albuterol inhaler which she should use 4 times daily as needed for cough, shortness of breath.  Normal on arrival today. ? ?The history is provided by the patient.  ?Past Medical History:  ?Diagnosis Date  ? Abnormal Pap smear of cervix 01-16-14  ? -pap nl but Pos.HR  HPV/Colpo 02-20-14--LSIL--needs repeat pap 7860yr.  ? Anxiety   ? in therapy again 10/2017  ? Hypercholesterolemia 2016  ? Hypertension   ? Miscarriage 05/14/2018  ? Pregnancy induced hypertension   ? ?Patient Active Problem List  ? Diagnosis Date Noted  ? Mild intermittent asthma 12/17/2021  ? GERD (gastroesophageal reflux disease) 12/12/2020  ? Upper airway cough syndrome 11/20/2020  ? Breech presentation of fetus 06/07/2019  ? Cesarean delivery delivered 06/07/2019  ? Anxiety 04/12/2018  ? Pain in joint of right shoulder 11/22/2017  ? History of arthroscopic procedure on shoulder 11/22/2017  ? ?Past Surgical History:  ?Procedure Laterality Date  ? CESAREAN SECTION N/A 06/07/2019  ? Procedure: CESAREAN SECTION;  Surgeon: Candice CampLowe, David, MD;  [facility];  Service: Obstetrics;  Laterality: N/A;  Primary ?edc 8/17 ?NKDA ?need RNFA  ? COLPOSCOPY    ? ROTATOR CUFF REPAIR Right 07/2017  ? Dr.Norris--[facility]--bone spur/arthritis  ? TOE SURGERY Bilateral   ? [place] child[place]  ? TONSILLECTOMY AND ADENOIDECTOMY    ? -age 41  ? ?OB History   ? ? Gravida  ?2  ? Para  ?   ? Term  ?   ? Preterm  ?   ? AB  ?1  ? Living  ?   ?  ? ? SAB  ?1  ? IAB  ?   ? Ectopic  ?   ? Multiple  ?   ?  Live Births  ?   ?   ?  ?  ? ?Home Medications   ? ?Prior to Admission medications   ?Medication Sig Start Date End Date Taking? Authorizing Provider  ?albuterol (VENTOLIN HFA) 108 (90 Base) MCG/ACT inhaler Inhale 2 puffs into the lungs as needed. 02/11/20   [provider]  ?benzonatate (TESSALON) 100 MG capsule Take 100 mg by mouth 3 (three) times daily. 11/23/21   [provider]  ?benzonatate (TESSALON) 100 MG capsule benzonatate 100 mg capsule ? TAKE 1 TO 2 CAPSULES BY MOUTH EVERY 8 TO 12 HOURS    [provider]  ?famotidine (PEPCID) 10 MG tablet Take 10 mg by mouth daily.    [provider]  ?Fluticasone Furoate (ARNUITY ELLIPTA) 100 MCG/ACT AEPB Inhale 1 puff into the lungs daily. 12/15/21   Glenford Bayley, NP  ?gabapentin (NEURONTIN) 100 MG capsule Take 100 mg by mouth 3 (three) times daily. ?Patient not taking: Reported on 12/15/2021 11/05/20   [provider]  ?hydrochlorothiazide (MICROZIDE) 12.5 MG capsule Take 12.5 mg by mouth daily. 11/23/21   [provider]  ?sertraline (ZOLOFT) 25 MG tablet Take 25 mg by mouth daily.    [provider]  ? ?Family History ?Family History  ?Problem Relation Age of Onset  ? Thyroid disease Mother   ? Osteoarthritis Mother   ? Hypertension Father   ? Hypertension Brother   ? Rheum arthritis Maternal Grandmother   ? ?Social History ?Social History  ? ?Tobacco Use  ? Smoking status: Former  ?  Packs/day: 0.25  ?  Types: Cigarettes  ?  Quit date: 05/14/2009  ?  Years since quitting: 12.7  ? Smokeless tobacco: Never  ? Tobacco comments:  ?  2-3 a day, 1 pack per week  ?Vaping Use  ? Vaping Use: Never used  ?Substance Use Topics  ? Alcohol use: Not Currently  ? Drug use: No  ? ?Allergies   ?Patient has no known allergies. ? ?Review of Systems ?Review of Systems ?Pertinent findings noted in history of present illness.  ? ?Physical Exam ?Triage Vital Signs ?ED Triage Vitals  ?Enc Vitals Group  ?   BP 09/04/21 0827 (!) 147/82  ?   Pulse Rate 09/04/21 0827 72  ?   Resp 09/04/21 0827 18  ?   Temp 09/04/21 0827 98.3 ?F (36.8 ?C)  ?   Temp Source 09/04/21 0827 Oral  ?   SpO2 09/04/21 0827 98 %  ?   Weight --   ?   Height --   ?   Head Circumference --   ?   Peak Flow --   ?   Pain Score 09/04/21 0826 5  ?   Pain Loc --   ?   Pain Edu? --   ?   Excl. in GC? --   ?No data found. ? ?Updated Vital Signs ?BP 124/87   Pulse 99   Temp 98.7 ?F (37.1 ?C)   Resp 20   LMP 01/30/2022   SpO2 99%  ? ?Physical Exam ?Vitals and nursing note reviewed.  ?Constitutional:   ?   General: She is not in acute distress. ?   Appearance: Normal appearance. She is not ill-appearing.  ?HENT:  ?   Head: Normocephalic and atraumatic.  ?   Salivary Glands: Right salivary gland is  not diffusely enlarged or tender. Left salivary gland is not diffusely enlarged or tender.  ?   Right Ear: Ear canal and  external ear normal. No drainage. A middle ear effusion is present. There is no impacted cerumen. Tympanic membrane is bulging. Tympanic membrane is not injected or erythematous.  ?   Left Ear: Ear canal and external ear normal. No drainage. A middle ear effusion is present. There is no impacted cerumen. Tympanic membrane is bulging. Tympanic membrane is not injected or erythematous.  ?   Ears:  ?   Comments: Bilateral EACs normal, right TM bulging with clear fluid, left TM retracted with purulent fluid ?   Nose: Mucosal edema, congestion and rhinorrhea present. No nasal deformity, septal deviation, signs of injury or nasal tenderness. Rhinorrhea is clear.  ?   Right Nostril: Occlusion present. No foreign body, epistaxis or septal hematoma.  ?   Left Nostril: Occlusion present. No foreign body, epistaxis or septal hematoma.  ?   Right Turbinates: Enlarged, swollen and pale.  ?   Left Turbinates: Enlarged, swollen and pale.  ?   Right Sinus: No maxillary sinus tenderness or frontal sinus tenderness.  ?   Left Sinus: No maxillary sinus tenderness or frontal sinus tenderness.  ?   Mouth/Throat:  ?   Lips: Pink. No lesions.  ?   Mouth: Mucous membranes are moist. No oral lesions.  ?   Pharynx: Oropharynx is clear. Uvula midline. No posterior oropharyngeal erythema or uvula swelling.  ?   Tonsils: No tonsillar exudate. 0 on the right. 0 on the left.  ?   Comments: Significant postnasal drip ?Eyes:  ?   General: Lids are normal.     ?   Right eye: No discharge.     ?   Left eye: No discharge.  ?   Extraocular Movements: Extraocular movements intact.  ?   Conjunctiva/sclera: Conjunctivae normal.  ?   Right eye: Right conjunctiva is not injected.  ?   Left eye: Left conjunctiva is not injected.  ?Neck:  ?   Trachea: Trachea and phonation normal.  ?Cardiovascular:  ?   Rate and Rhythm: Normal rate and  regular rhythm.  ?   Pulses: Normal pulses.  ?   Heart sounds: Normal heart sounds. No murmur heard. ?  No friction rub. No gallop.  ?Pulmonary:  ?   Effort: Pulmonary effort is normal. No accessory muscle usage, prolon

## 2022-02-09 NOTE — Discharge Instructions (Addendum)
Your symptoms and physical exam findings are concerning for a viral respiratory infection that is aggravated your underlying allergies.  The combination of these 2 together is the most likely cause of your left ear infection as well.  Based on my physical exam findings, I believe you are also suffering from acute bronchitis.Please see the list below for recommended medications, dosages and frequencies to provide relief of your current symptoms:   ?  ?Based on my physical exam findings and the history you have provided  today, I I absolutely recommend that you complete Augmentin as prescribed.  This is a very effective antibiotic for acute bacterial ear infections.  Because we know that prolonged exposure to antibiotics causes women to acquire vaginal yeast infections, I have also provided you with a prescription for Diflucan.  Please take as directed.     ?  ?Please see the list below for recommended medications, dosages and frequencies to provide relief of your current symptoms:   ? ?ProAir, Ventolin, Proventil (albuterol): This inhaled medication contains a short acting beta agonist bronchodilator.  This is also known as a rescue inhaler.  Use this medication in addition to your daily Arnuity or, for the next 2 weeks, Trelegy.  This medication works on the smooth muscle that opens and constricts of your airways by relaxing the muscle.  The result of relaxation of the smooth muscle is increased air movement and improved work of breathing.  This is a short acting medication that can be used every 4-6 hours as needed for increased work of breathing, shortness of breath, wheezing and excessive coughing.  I have provided you with a prescription.  ?  ?Trelegy (fluticasone, vilanterol and umeclidinium):  This inhaled medication contains a corticosteroid and long-acting form of albuterol.  The inhaled steroid and this medication  is not absorbed into the body and will not cause side effects such as increased blood sugar  levels, irritability, sleeplessness or weight gain.  Inhaled corticosteroid are sort of like topical steroid creams but, as you can imagine, it is not practical to attempt to rub a steroid cream inside of your lungs.  The long-acting albuterol works similarly to the short acting albuterol found in your rescue inhaler but provides 24-hour relaxation of the smooth muscles that open and constrict your airways; your short acting rescue inhaler can only provide for a few hours this benefit for a few hours.  The third unique ingredient, umeclidinium, is an antimuscarinic and works similarly to your albuterol, providing long-acting relaxation of the smooth muscles in your airway.  Please feel free to continue using your short acting rescue inhaler as often as needed throughout the day for shortness of breath, wheezing, and cough.  I have provided you with a 14-day sample.  Once you have completed this sample, please return to using your Arnuity along with albuterol as needed. ? ?Allegra (fexofenadine): This is an excellent second-generation antihistamine that helps to reduce respiratory inflammatory response to environmental allergens.  This medication is not known to cause daytime sleepiness so it can be taken in the daytime.  If you find that it does make you sleepy, please feel free to take it at bedtime. ?  ?Flonase (fluticasone): This is a steroid nasal spray that you use once daily, 1 spray in each nare.  This medication does not work well if you decide to use it only used as you feel you need to, it works best used on a daily basis.  After 3 to 5 days  of use, you will notice significant reduction of the inflammation and mucus production that is currently being caused by exposure to allergens, whether seasonal or environmental.  The most common side effect of this medication is nosebleeds.  If you experience a nosebleed, please discontinue use for 1 week, then feel free to resume.  I have provided you with a  prescription but you can also purchase this medication over-the-counter if your insurance will not cover it. ?  ?Ibuprofen  (Advil, Motrin): This is a good anti-inflammatory medication which not only addresses aches, pains but also significantly reduces soft tissue inflammation of the upper airways that causes sinus and nasal congestion as well as inflammation of the lower airways which makes you feel like your breathing is constricted or your cough feel tight.  I recommend that you take between 400 mg every 8 hours as needed.    ?  ?Guaifenesin (Robitussin, Mucinex): This is an expectorant.  This helps break up chest congestion and loosen up thick nasal drainage making phlegm and drainage more liquid and therefore easier to remove.  I recommend being 400 mg three times daily as needed.    ?  ?Promethazine DM: Promethazine is both a nasal decongestant and an antinausea medication that makes most patients feel fairly sleepy.  The DM is dextromethorphan, a cough suppressant found in many over-the-counter cough medications.  Please take 5 mL before bedtime to minimize your cough which will help you sleep better.  I have provided you with a prescription for this medication.    ? ?Not taking your allergy medications on a regular basis can increase your risk of more frequent upper respiratory infections that may or may not require the use of antibiotics, serious exacerbations that require the use of oral steroids, loss of time at work and missed social opportunities. ?  ?If you find that you have not had significant relief of your symptoms in the next 3 to 5 days, please follow-up with your primary care provider or return to urgent care for repeat evaluation. ?  ?Thank you for visiting urgent care today.  We appreciate the opportunity to participate in your care. ? ? ?  ? ?

## 2022-02-22 ENCOUNTER — Ambulatory Visit (INDEPENDENT_AMBULATORY_CARE_PROVIDER_SITE_OTHER): Payer: Commercial Managed Care - PPO | Admitting: Internal Medicine

## 2022-02-22 DIAGNOSIS — R058 Other specified cough: Secondary | ICD-10-CM

## 2022-02-22 LAB — PULMONARY FUNCTION TEST
DL/VA % pred: 138 %
DL/VA: 6.21 ml/min/mmHg/L
DLCO cor % pred: 127 %
DLCO cor: 26.43 ml/min/mmHg
DLCO unc % pred: 127 %
DLCO unc: 26.43 ml/min/mmHg
FEF 25-75 Post: 4.8 L/sec
FEF 25-75 Pre: 4.99 L/sec
FEF2575-%Change-Post: -3 %
FEF2575-%Pred-Post: 157 %
FEF2575-%Pred-Pre: 164 %
FEV1-%Change-Post: 0 %
FEV1-%Pred-Post: 96 %
FEV1-%Pred-Pre: 96 %
FEV1-Post: 2.76 L
FEV1-Pre: 2.76 L
FEV1FVC-%Change-Post: 0 %
FEV1FVC-%Pred-Pre: 111 %
FEV6-%Change-Post: 0 %
FEV6-%Pred-Post: 86 %
FEV6-%Pred-Pre: 86 %
FEV6-Post: 2.98 L
FEV6-Pre: 3 L
FEV6FVC-%Pred-Post: 101 %
FEV6FVC-%Pred-Pre: 101 %
FVC-%Change-Post: 0 %
FVC-%Pred-Post: 84 %
FVC-%Pred-Pre: 85 %
FVC-Post: 2.98 L
FVC-Pre: 3 L
Post FEV1/FVC ratio: 93 %
Post FEV6/FVC ratio: 100 %
Pre FEV1/FVC ratio: 92 %
Pre FEV6/FVC Ratio: 100 %
RV % pred: 95 %
RV: 1.44 L
TLC % pred: 95 %
TLC: 4.59 L

## 2022-02-22 NOTE — Progress Notes (Signed)
Full PFT completed today ? ?

## 2022-02-23 ENCOUNTER — Ambulatory Visit (INDEPENDENT_AMBULATORY_CARE_PROVIDER_SITE_OTHER): Payer: Commercial Managed Care - PPO | Admitting: Primary Care

## 2022-02-23 ENCOUNTER — Encounter: Payer: Self-pay | Admitting: Primary Care

## 2022-02-23 VITALS — BP 128/68 | HR 88 | Temp 98.6°F | Ht 62.5 in | Wt 301.4 lb

## 2022-02-23 DIAGNOSIS — H938X2 Other specified disorders of left ear: Secondary | ICD-10-CM | POA: Diagnosis not present

## 2022-02-23 DIAGNOSIS — R0609 Other forms of dyspnea: Secondary | ICD-10-CM

## 2022-02-23 DIAGNOSIS — R053 Chronic cough: Secondary | ICD-10-CM | POA: Diagnosis not present

## 2022-02-23 DIAGNOSIS — R058 Other specified cough: Secondary | ICD-10-CM

## 2022-02-23 LAB — BASIC METABOLIC PANEL
BUN: 14 mg/dL (ref 6–23)
CO2: 32 mEq/L (ref 19–32)
Calcium: 9.7 mg/dL (ref 8.4–10.5)
Chloride: 98 mEq/L (ref 96–112)
Creatinine, Ser: 0.83 mg/dL (ref 0.40–1.20)
GFR: 88.26 mL/min (ref >60.00)
Glucose, Bld: 93 mg/dL (ref 70–99)
Potassium: 4 mEq/L (ref 3.5–5.1)
Sodium: 137 mEq/L (ref 135–145)

## 2022-02-23 LAB — BRAIN NATRIURETIC PEPTIDE: Pro B Natriuretic peptide (BNP): 18 pg/mL (ref 0.0–100.0)

## 2022-02-23 NOTE — Progress Notes (Signed)
BMET and BNP were normal. No need for echocardiogram at this time

## 2022-02-23 NOTE — Progress Notes (Signed)
? ?@Patient  ID: Debra [name], female    DOB: 11-09-1980, 41 y.o.   MRN: [id number] ? ?Chief Complaint  ?Patient presents with  ? Follow-up  ?  PFT performed 4/17.  Pt states she has been doing okay since last visit. States her cough is better.  ? ? ?Referring provider: ?Willeen Niece, [place] ? ?HPI: ?41 year old female, former smoker. PMH significant upper airway cough, GERD. Former patient of Dr. Carlis Abbott, last seen by pulmonary NP 12/12/20.  ? ?Previous [facility] pulmonary encounter: ?12/15/2021 ?Patient presents today for 1 year follow-up/acute visit. She has had a cough for 6 weeks, started in December 2022. She went to UC and was treated with prednisone which helped some but cough returned shortly after completing. She has been taking Zyrtec and Pepcid regularly. Thinks there may be "something" triggering her symptoms coming from air ducks in her home, getting this check out next week. She is originally from [place] originally, has been her for about 7 years. She believes her dad had hyperreactive airways disease. Denies f/c/s, sob, chest tightness or wheezing.  ? ?02/23/2022- Interim hx  ?Patient presents today for 4-6 week follow-up with PFTs. During her last visit we started her on Arnuity 171mcg and added flonase nasal spray prn. PFTs and FENO were normal. She did have increased diffusion defect on pulmonary function testing, concern for left sided heart failure. No leg swelling. BNP was normal today on lab work. She went to ED on 4/4 for acute bronchitis, treated with Augmentin. Given trelegy for 2 weeks but did not pick this up. She has beeing using Arnuity, starting to taper use. She is compliant with Allegra and flonase for allergies. Still has some left sided ear congestion. Cough has improved, states that it is not as persistent.  ? ?02/22/22 PFTs>> FVC 2.98 (84%), FEV1 2.76 (96%), ratio 93, TLC 95%, DLCOunc 26.43 (127) ? ? ?No Known Allergies ? ?Immunization History  ?Administered Date(s) Administered  ? Influenza  Inj Mdck Quad Pf 07/29/2018  ? Influenza Inj Mdck Quad With Preservative 10/18/2017  ? Influenza,inj,Quad PF,6+ Mos 07/09/2020  ? Influenza,inj,Quad PF,6-35 Mos 07/16/2019  ? Influenza-Unspecified 10/18/2017, 07/09/2021  ? PFIZER(Purple Top)SARS-COV-2 Vaccination 01/19/2020, 02/20/2020, 09/15/2020  ? Tdap 03/14/2019, 04/14/2019  ? ? ?Past Medical History:  ?Diagnosis Date  ? Abnormal Pap smear of cervix 01-16-14  ? -pap nl but Pos.HR HPV/Colpo 02-20-14--LSIL--needs repeat pap 37yr.  ? Anxiety   ? in therapy again 10/2017  ? Hypercholesterolemia 2016  ? Hypertension   ? Miscarriage 05/14/2018  ? Pregnancy induced hypertension   ? ? ?Tobacco History: ?Social History  ? ?Tobacco Use  ?Smoking Status Former  ? Packs/day: 0.25  ? Types: Cigarettes  ? Quit date: 05/14/2009  ? Years since quitting: 12.7  ?Smokeless Tobacco Never  ?Tobacco Comments  ? 2-3 a day, 1 pack per week  ? ?Counseling given: Not Answered ?Tobacco comments: 2-3 a day, 1 pack per week ? ? ?Outpatient Medications Prior to Visit  ?Medication Sig Dispense Refill  ? albuterol (VENTOLIN HFA) 108 (90 Base) MCG/ACT inhaler Inhale 2 puffs into the lungs every 6 (six) hours as needed for wheezing or shortness of breath (Cough). 18 g 2  ? fexofenadine (ALLEGRA) 180 MG tablet Take 1 tablet (180 mg total) by mouth daily. 90 tablet 1  ? fluticasone (FLONASE) 50 MCG/ACT nasal spray Place 1 spray into both nostrils daily. Begin by using 2 sprays in each nare daily for 3 to 5 days, then decrease to 1 spray in  each nare daily. 32 mL 1  ? Fluticasone Furoate (ARNUITY ELLIPTA) 100 MCG/ACT AEPB Inhale 1 puff into the lungs daily. 30 each 3  ? hydrochlorothiazide (MICROZIDE) 12.5 MG capsule Take 12.5 mg by mouth daily.    ? Multiple Vitamin (MULTI-VITAMIN DAILY PO) Multi Vitamin    ? sertraline (ZOLOFT) 25 MG tablet Take 25 mg by mouth daily.    ? Turmeric 400 MG CAPS Take by mouth.    ? famotidine (PEPCID) 10 MG tablet Take 10 mg by mouth daily.    ? fluconazole (DIFLUCAN)  150 MG tablet Take 1 tablet on day 4 of antibiotics.  Take second tablet 3 days later. 2 tablet 0  ? Fluticasone-Umeclidin-Vilant (TRELEGY ELLIPTA) 200-62.5-25 MCG/ACT AEPB Inhale 1 puff into the lungs in the morning for 14 days. 1 each 0  ? gabapentin (NEURONTIN) 100 MG capsule Take 100 mg by mouth 3 (three) times daily. (Patient not taking: Reported on 12/15/2021)    ? guaifenesin (HUMIBID E) 400 MG TABS tablet Take 1 tablet 3 times daily as needed for chest congestion and cough 21 tablet 0  ? ibuprofen (ADVIL) 400 MG tablet Take 1 tablet (400 mg total) by mouth every 8 (eight) hours as needed for up to 30 doses. 30 tablet 0  ? promethazine-dextromethorphan (PROMETHAZINE-DM) 6.25-15 MG/5ML syrup Take 5 mLs by mouth 4 (four) times daily as needed for cough. 118 mL 0  ? ?No facility-administered medications prior to visit.  ? ?Review of Systems ? ?Review of Systems  ?Constitutional: Negative.   ?HENT:  Positive for congestion and ear pain.   ?Respiratory:  Positive for cough. Negative for chest tightness, shortness of breath and wheezing.   ?Cardiovascular: Negative.   ? ? ?Physical Exam ? ?BP 128/68 (BP Location: Right Arm, Patient Position: Sitting, Cuff Size: Large)   Pulse 88   Temp 98.6 ?F (37 ?C) (Oral)   Ht 5' 2.5" (1.588 m)   Wt (!) 301 lb 6.4 oz (136.7 kg)   LMP 01/30/2022   SpO2 97% Comment: RA  BMI 54.25 kg/m?  ?Physical Exam ?Constitutional:   ?   General: She is not in acute distress. ?   Appearance: Normal appearance. She is not ill-appearing.  ?HENT:  ?   Right Ear: Tympanic membrane normal. There is no impacted cerumen.  ?   Left Ear: Tympanic membrane normal. There is no impacted cerumen.  ?   Nose: No congestion.  ?Cardiovascular:  ?   Rate and Rhythm: Normal rate and regular rhythm.  ?Pulmonary:  ?   Effort: Pulmonary effort is normal.  ?   Breath sounds: Normal breath sounds.  ?Skin: ?   General: Skin is warm and dry.  ?Neurological:  ?   General: No focal deficit present.  ?   Mental  Status: She is alert and oriented to person, place, and time. Mental status is at baseline.  ?Psychiatric:     ?   Mood and Affect: Mood normal.     ?   Behavior: Behavior normal.     ?   Thought Content: Thought content normal.     ?   Judgment: Judgment normal.  ?  ? ?Lab Results: ? ?CBC ?   ?Component Value Date/Time  ? WBC 16.4 (H) 11/18/2020 1705  ? RBC 5.01 11/18/2020 1705  ? HGB 14.7 11/18/2020 1705  ? HCT 43.4 11/18/2020 1705  ? PLT 430.0 (H) 11/18/2020 1705  ? MCV 86.6 11/18/2020 1705  ? MCH 30.4 06/08/2019 0535  ?  MCHC 33.8 11/18/2020 1705  ? RDW 14.3 11/18/2020 1705  ? LYMPHSABS 4.1 (H) 11/18/2020 1705  ? MONOABS 0.9 11/18/2020 1705  ? EOSABS 0.5 11/18/2020 1705  ? BASOSABS 0.1 11/18/2020 1705  ? ? ?BMET ?   ?Component Value Date/Time  ? NA 137 02/23/2022 1100  ? K 4.0 02/23/2022 1100  ? CL 98 02/23/2022 1100  ? CO2 32 02/23/2022 1100  ? GLUCOSE 93 02/23/2022 1100  ? BUN 14 02/23/2022 1100  ? CREATININE 0.83 02/23/2022 1100  ? CALCIUM 9.7 02/23/2022 1100  ? GFRNONAA >60 06/07/2019 1024  ? GFRAA >60 06/07/2019 1024  ? ? ?BNP ?No results found for: BNP ? ?ProBNP ?   ?Component Value Date/Time  ? PROBNP 18.0 02/23/2022 1100  ? ? ?Imaging: ?No results found. ? ? ?Assessment & Plan:  ? ?Ear congestion, left ?- Recently treated for acute bronchitis with Augmentin. Symptoms have largely resolved. She has residual left ear congestion. TMs appear normal on exam, mild erythema. Recommend she continue flonase and Allegra. Advised she start taking OTC sudafed as needed for the next 5 days for congestion (monitor BP and if elevated discontinue) ? ?Upper airway cough syndrome ?- Patient has reactive airways with most URIs. Treated temporarily with ICS. Symptoms improved. Advised to taper Arnuity to 1 puff every other day and then prn. If cough returned she will notify office and likely need to resume  ? ?Dyspnea on exertion ?- Symptoms are very minimal and do not interfere with ADLs. PFTs were normal, DLCO was elevated  concerning for possible heart failure. She has no leg swelling and BNP was normal. No evidence of HF on exam. Would hold off on further testing unless she develops shortness of breath ? ? ?Martyn Ehrich, NP ?[facility]

## 2022-02-23 NOTE — Patient Instructions (Addendum)
Recommendations: ?- Taper Arnuity to every other day, then as needed only (if cough return, please resume daily use and notify us) ?- Continue flonase, allegra ?- Start sudafed for congestion/ear blockage x 5 days (stop if blood pressure running high) ? ?Orders: ?- BNP and BMET  ? ?Follow-up: ?- 6 months with Beth or sooner if needed  ?

## 2022-02-24 DIAGNOSIS — R0609 Other forms of dyspnea: Secondary | ICD-10-CM | POA: Insufficient documentation

## 2022-02-24 DIAGNOSIS — H938X2 Other specified disorders of left ear: Secondary | ICD-10-CM | POA: Insufficient documentation

## 2022-02-24 NOTE — Assessment & Plan Note (Addendum)
-   Recently treated for acute bronchitis with Augmentin. Symptoms have largely resolved. She has residual left ear congestion. TMs appear normal on exam, mild erythema. Recommend she continue flonase and Allegra. Advised she start taking OTC sudafed as needed for the next 5 days for congestion (monitor BP and if elevated discontinue) ?

## 2022-02-24 NOTE — Assessment & Plan Note (Addendum)
-   Patient has reactive airways with most URIs. Treated temporarily with ICS. Symptoms improved. Advised to taper Arnuity to 1 puff every other day and then prn. If cough returned she will notify office and likely need to resume  ?

## 2022-02-24 NOTE — Assessment & Plan Note (Signed)
-   Symptoms are very minimal and do not interfere with ADLs. PFTs were normal, DLCO was elevated concerning for possible heart failure. She has no leg swelling and BNP was normal. No evidence of HF on exam. Would hold off on further testing unless she develops shortness of breath ?

## 2022-02-24 NOTE — Progress Notes (Signed)
Reviewed and agree with assessment/plan. ? ? ?Coralyn Helling, MD ?[facility] Pulmonary/Critical Care ?02/24/2022, 10:56 AM ?Pager:  [phone] ? ?

## 2022-08-25 ENCOUNTER — Ambulatory Visit (INDEPENDENT_AMBULATORY_CARE_PROVIDER_SITE_OTHER): Payer: Commercial Managed Care - PPO | Admitting: Primary Care

## 2022-08-25 ENCOUNTER — Encounter: Payer: Self-pay | Admitting: Primary Care

## 2022-08-25 VITALS — BP 128/80 | HR 97 | Temp 98.4°F | Ht 63.0 in | Wt 309.0 lb

## 2022-08-25 DIAGNOSIS — Z23 Encounter for immunization: Secondary | ICD-10-CM

## 2022-08-25 DIAGNOSIS — J3089 Other allergic rhinitis: Secondary | ICD-10-CM

## 2022-08-25 DIAGNOSIS — J302 Other seasonal allergic rhinitis: Secondary | ICD-10-CM | POA: Insufficient documentation

## 2022-08-25 DIAGNOSIS — J4520 Mild intermittent asthma, uncomplicated: Secondary | ICD-10-CM | POA: Diagnosis not present

## 2022-08-25 MED ORDER — ARNUITY ELLIPTA 100 MCG/ACT IN AEPB: 1.0000 | INHALATION_SPRAY | Freq: Every day | RESPIRATORY_TRACT | 5 refills | Status: DC

## 2022-08-25 MED ORDER — ALBUTEROL SULFATE HFA 108 (90 BASE) MCG/ACT IN AERS: 2.0000 | INHALATION_SPRAY | Freq: Four times a day (QID) | RESPIRATORY_TRACT | 2 refills | Status: AC | PRN

## 2022-08-25 NOTE — Patient Instructions (Signed)
Great seeing you today, you look well No changes today  Recommendations Continue to use Arnuity 1 puff daily as needed for cough/chest tightness Use albuterol 2 puffs every 4-6 hours breakthrough symptoms Continue Allegra 180 mg 1 tablet daily during allergy season Continue Flonase nasal spray 1 puff per nostril daily as needed for nasal congestion Call us if you develop worsening respiratory symptoms or cough that does not improve with above regimen  Follow-up 1 year with [facility] NP or sooner if needed

## 2022-08-25 NOTE — Assessment & Plan Note (Addendum)
-   Stable; No active symptoms - Continue PRN flonase and Allegra 180mg

## 2022-08-25 NOTE — Progress Notes (Signed)
@Patient  ID: Debra [name], female    DOB: 07-23-1981, 41 y.o.   MRN: [id number]  Chief Complaint  Patient presents with   Follow-up    Referring provider: Willeen Niece, [place]  HPI: 41 year old female, former smoker. PMH significant upper airway cough, GERD. Former patient of Dr. Carlis Abbott, last seen by pulmonary NP 12/12/20.   Previous [facility] pulmonary encounter: 12/15/2021 Patient presents today for 1 year follow-up/acute visit. She has had a cough for 6 weeks, started in December 2022. She went to UC and was treated with prednisone which helped some but cough returned shortly after completing. She has been taking Zyrtec and Pepcid regularly. Thinks there may be "something" triggering her symptoms coming from air ducks in her home, getting this check out next week. She is originally from [place] originally, has been her for about 7 years. She believes her dad had hyperreactive airways disease. Denies f/c/s, sob, chest tightness or wheezing.   02/23/2022 Patient presents today for 4-6 week follow-up with PFTs. During her last visit we started her on Arnuity 133mcg and added flonase nasal spray prn. PFTs and FENO were normal. She did have increased diffusion defect on pulmonary function testing, concern for left sided heart failure. No leg swelling. BNP was normal today on lab work. She went to ED on 4/4 for acute bronchitis, treated with Augmentin. Given trelegy for 2 weeks but did not pick this up. She has been using Arnuity, starting to taper use. She is compliant with Allegra and flonase for allergies. Still has some left sided ear congestion. Cough has improved, states that it is not as persistent.   08/25/2022 - Interim hx  Patient presents today for 6 month follow-up. She has no current respiratory symptoms at this time She feels Arnuity has helped her cough significantly She has has two colds since her last visit, symptoms only lasted for 2 days and did not turn into asthma flare She uses  Arnuity as needed only  She has not needed to use albuterol but keeps inhaler with her  She uses flonase as needed and takes allegra during allergy season Denies shortness of breath, wheezing, chest tightness or cough   02/22/22 PFTs>> FVC 2.98 (84%), FEV1 2.76 (96%), ratio 93, TLC 95%, DLCOunc 26.43 (127)   No Known Allergies  Immunization History  Administered Date(s) Administered   Influenza Inj Mdck Quad Pf 07/29/2018   Influenza Inj Mdck Quad With Preservative 10/18/2017   Influenza,inj,Quad PF,6+ Mos 07/09/2020, 08/25/2022   Influenza,inj,Quad PF,6-35 Mos 07/16/2019   Influenza-Unspecified 10/18/2017, 07/09/2021   PFIZER(Purple Top)SARS-COV-2 Vaccination 01/19/2020, 02/20/2020, 09/15/2020   Tdap 03/14/2019, 04/14/2019    Past Medical History:  Diagnosis Date   Abnormal Pap smear of cervix 01-16-14   -pap nl but Pos.HR HPV/Colpo 02-20-14--LSIL--needs repeat pap 73yr.   Anxiety    in therapy again 10/2017   Hypercholesterolemia 2016   Hypertension    Miscarriage 05/14/2018   Pregnancy induced hypertension     Tobacco History: Social History   Tobacco Use  Smoking Status Former   Packs/day: 0.25   Types: Cigarettes   Quit date: 05/14/2009   Years since quitting: 13.2  Smokeless Tobacco Never  Tobacco Comments   2-3 a day, 1 pack per week   Counseling given: Not Answered Tobacco comments: 2-3 a day, 1 pack per week   Outpatient Medications Prior to Visit  Medication Sig Dispense Refill   fluticasone (FLONASE) 50 MCG/ACT nasal spray Place 1 spray into both nostrils daily. Begin by  using 2 sprays in each nare daily for 3 to 5 days, then decrease to 1 spray in each nare daily. 32 mL 1   hydrochlorothiazide (MICROZIDE) 12.5 MG capsule Take 12.5 mg by mouth daily.     Multiple Vitamin (MULTI-VITAMIN DAILY PO) Multi Vitamin     sertraline (ZOLOFT) 25 MG tablet Take 25 mg by mouth daily.     Turmeric 400 MG CAPS Take by mouth.     albuterol (VENTOLIN HFA) 108 (90 Base)  MCG/ACT inhaler Inhale 2 puffs into the lungs every 6 (six) hours as needed for wheezing or shortness of breath (Cough). 18 g 2   Fluticasone Furoate (ARNUITY ELLIPTA) 100 MCG/ACT AEPB Inhale 1 puff into the lungs daily. 30 each 3   fexofenadine (ALLEGRA) 180 MG tablet Take 1 tablet (180 mg total) by mouth daily. 90 tablet 1   No facility-administered medications prior to visit.   Review of Systems  Review of Systems  Constitutional: Negative.   HENT: Negative.    Respiratory: Negative.      Physical Exam  BP 128/80 (BP Location: Left Arm, Patient Position: Sitting, Cuff Size: Large)   Pulse 97   Temp 98.4 F (36.9 C) (Oral)   Ht 5\' 3"  (1.6 m)   Wt (!) 309 lb (140.2 kg)   SpO2 98%   BMI 54.74 kg/m  Physical Exam Constitutional:      Appearance: Normal appearance.  HENT:     Head: Normocephalic and atraumatic.  Cardiovascular:     Rate and Rhythm: Normal rate and regular rhythm.  Pulmonary:     Effort: Pulmonary effort is normal.     Breath sounds: Normal breath sounds. No wheezing, rhonchi or rales.  Musculoskeletal:        General: Normal range of motion.  Skin:    General: Skin is warm and dry.  Neurological:     General: No focal deficit present.     Mental Status: She is alert and oriented to person, place, and time. Mental status is at baseline.  Psychiatric:        Mood and Affect: Mood normal.        Behavior: Behavior normal.        Thought Content: Thought content normal.        Judgment: Judgment normal.      Lab Results:  CBC    Component Value Date/Time   WBC 16.4 (H) 11/18/2020 1705   RBC 5.01 11/18/2020 1705   HGB 14.7 11/18/2020 1705   HCT 43.4 11/18/2020 1705   PLT 430.0 (H) 11/18/2020 1705   MCV 86.6 11/18/2020 1705   MCH 30.4 06/08/2019 0535   MCHC 33.8 11/18/2020 1705   RDW 14.3 11/18/2020 1705   LYMPHSABS 4.1 (H) 11/18/2020 1705   MONOABS 0.9 11/18/2020 1705   EOSABS 0.5 11/18/2020 1705   BASOSABS 0.1 11/18/2020 1705    BMET     Component Value Date/Time   NA 137 02/23/2022 1100   K 4.0 02/23/2022 1100   CL 98 02/23/2022 1100   CO2 32 02/23/2022 1100   GLUCOSE 93 02/23/2022 1100   BUN 14 02/23/2022 1100   CREATININE 0.83 02/23/2022 1100   CALCIUM 9.7 02/23/2022 1100   GFRNONAA >60 06/07/2019 1024   GFRAA >60 06/07/2019 1024    BNP No results found for: "BNP"  ProBNP    Component Value Date/Time   PROBNP 18.0 02/23/2022 1100    Imaging: No results found.   Assessment & Plan:  Mild intermittent asthma - Well controlled on PRN Arnuity. No current respiratory symptoms or recent exacerbations. ACT score 25. No SABA use. Refills provided. Advised patient if she developed acute bronchitis or any respiratory symptoms that do not improve with medications to call office. FU in 1 year or sooner.   Allergic rhinitis, seasonal - Stable; No active symptoms - Continue PRN flonase and Allegra 180mg     , NP 08/25/2022

## 2022-08-25 NOTE — Assessment & Plan Note (Addendum)
-   Well controlled on PRN Arnuity. No current respiratory symptoms or recent exacerbations. ACT score 25. No SABA use. Refills provided. Advised patient if she developed acute bronchitis or any respiratory symptoms that do not improve with medications to call office. FU in 1 year or sooner.

## 2022-08-26 NOTE — Progress Notes (Signed)
Reviewed and agree with assessment/plan.   Jahni Paul, MD [place] [facility]/Critical [facility] 08/26/2022, 11:25 AM Pager:  [phone]  

## 2022-09-18 ENCOUNTER — Emergency Department (HOSPITAL_BASED_OUTPATIENT_CLINIC_OR_DEPARTMENT_OTHER): Payer: Commercial Managed Care - PPO

## 2022-09-18 ENCOUNTER — Encounter (HOSPITAL_BASED_OUTPATIENT_CLINIC_OR_DEPARTMENT_OTHER): Payer: Self-pay | Admitting: Emergency Medicine

## 2022-09-18 ENCOUNTER — Emergency Department (HOSPITAL_BASED_OUTPATIENT_CLINIC_OR_DEPARTMENT_OTHER)
Admission: EM | Admit: 2022-09-18 | Discharge: 2022-09-18 | Disposition: A | Discharge status: Home / Self Care | Payer: Commercial Managed Care - PPO | Attending: Emergency Medicine | Admitting: Emergency Medicine

## 2022-09-18 ENCOUNTER — Other Ambulatory Visit: Payer: Self-pay

## 2022-09-18 DIAGNOSIS — I10 Essential (primary) hypertension: Secondary | ICD-10-CM | POA: Insufficient documentation

## 2022-09-18 DIAGNOSIS — Z79899 Other long term (current) drug therapy: Secondary | ICD-10-CM | POA: Diagnosis not present

## 2022-09-18 DIAGNOSIS — K8020 Calculus of gallbladder without cholecystitis without obstruction: Secondary | ICD-10-CM | POA: Diagnosis not present

## 2022-09-18 DIAGNOSIS — R1011 Right upper quadrant pain: Secondary | ICD-10-CM | POA: Diagnosis present

## 2022-09-18 LAB — CBC WITH DIFFERENTIAL/PLATELET
Abs Immature Granulocytes: 0.1 10*3/uL — ABNORMAL HIGH (ref 0.00–0.07)
Basophils Absolute: 0.1 10*3/uL (ref 0.0–0.1)
Basophils Relative: 1 %
Eosinophils Absolute: 0.1 10*3/uL (ref 0.0–0.5)
Eosinophils Relative: 1 %
HCT: 40.2 % (ref 36.0–46.0)
Hemoglobin: 13.4 g/dL (ref 12.0–15.0)
Immature Granulocytes: 1 %
Lymphocytes Relative: 17 %
Lymphs Abs: 2.1 10*3/uL (ref 0.7–4.0)
MCH: 29.7 pg (ref 26.0–34.0)
MCHC: 33.3 g/dL (ref 30.0–36.0)
MCV: 89.1 fL (ref 80.0–100.0)
Monocytes Absolute: 0.5 10*3/uL (ref 0.1–1.0)
Monocytes Relative: 4 %
Neutro Abs: 9.8 10*3/uL — ABNORMAL HIGH (ref 1.7–7.7)
Neutrophils Relative %: 76 %
Platelets: 428 10*3/uL — ABNORMAL HIGH (ref 150–400)
RBC: 4.51 MIL/uL (ref 3.87–5.11)
RDW: 13.1 % (ref 11.5–15.5)
WBC: 12.7 10*3/uL — ABNORMAL HIGH (ref 4.0–10.5)
nRBC: 0 % (ref 0.0–0.2)

## 2022-09-18 LAB — URINALYSIS, ROUTINE W REFLEX MICROSCOPIC
Bilirubin Urine: NEGATIVE
Glucose, UA: NEGATIVE mg/dL
Hgb urine dipstick: NEGATIVE
Ketones, ur: NEGATIVE mg/dL
Leukocytes,Ua: NEGATIVE
Nitrite: NEGATIVE
Protein, ur: >=300 mg/dL — AB
Specific Gravity, Urine: >=1.03 (ref 1.005–1.030)
pH: 6 (ref 5.0–8.0)

## 2022-09-18 LAB — URINALYSIS, MICROSCOPIC (REFLEX)

## 2022-09-18 LAB — COMPREHENSIVE METABOLIC PANEL
ALT: 42 U/L (ref 0–44)
AST: 37 U/L (ref 15–41)
Albumin: 3.9 g/dL (ref 3.5–5.0)
Alkaline Phosphatase: 75 U/L (ref 38–126)
Anion gap: 9 (ref 5–15)
BUN: 15 mg/dL (ref 6–20)
CO2: 25 mmol/L (ref 22–32)
Calcium: 8.9 mg/dL (ref 8.9–10.3)
Chloride: 102 mmol/L (ref 98–111)
Creatinine, Ser: 0.82 mg/dL (ref 0.44–1.00)
GFR, Estimated: >60 mL/min (ref >60)
Glucose, Bld: 128 mg/dL — ABNORMAL HIGH (ref 70–99)
Potassium: 4.1 mmol/L (ref 3.5–5.1)
Sodium: 136 mmol/L (ref 135–145)
Total Bilirubin: 0.5 mg/dL (ref 0.3–1.2)
Total Protein: 7.8 g/dL (ref 6.5–8.1)

## 2022-09-18 LAB — LIPASE, BLOOD: Lipase: 31 U/L (ref 11–51)

## 2022-09-18 LAB — PREGNANCY, URINE: Preg Test, Ur: NEGATIVE

## 2022-09-18 MED ORDER — ONDANSETRON HCL 4 MG/2ML IJ SOLN
4.0000 mg | Freq: Once | INTRAMUSCULAR | Status: AC
  Administered 2022-09-18: 4 mg via INTRAVENOUS
  Filled 2022-09-18: qty 2

## 2022-09-18 MED ORDER — IOHEXOL 300 MG/ML  SOLN
100.0000 mL | Freq: Once | INTRAMUSCULAR | Status: AC | PRN
  Administered 2022-09-18: 100 mL via INTRAVENOUS

## 2022-09-18 MED ORDER — PANTOPRAZOLE SODIUM 20 MG PO TBEC: 20.0000 mg | DELAYED_RELEASE_TABLET | Freq: Every day | ORAL | 0 refills | Status: AC

## 2022-09-18 MED ORDER — ONDANSETRON HCL 4 MG PO TABS: 4.0000 mg | ORAL_TABLET | Freq: Four times a day (QID) | ORAL | 0 refills | Status: AC

## 2022-09-18 MED ORDER — MORPHINE SULFATE (PF) 4 MG/ML IV SOLN
4.0000 mg | Freq: Once | INTRAVENOUS | Status: AC
  Administered 2022-09-18: 4 mg via INTRAVENOUS
  Filled 2022-09-18: qty 1

## 2022-09-18 MED ORDER — FENTANYL CITRATE PF 50 MCG/ML IJ SOSY
50.0000 ug | PREFILLED_SYRINGE | Freq: Once | INTRAMUSCULAR | Status: AC
  Administered 2022-09-18: 50 ug via INTRAVENOUS
  Filled 2022-09-18: qty 1

## 2022-09-18 MED ORDER — OXYCODONE HCL 5 MG PO TABS: 5.0000 mg | ORAL_TABLET | Freq: Four times a day (QID) | ORAL | 0 refills | Status: AC | PRN

## 2022-09-18 MED ORDER — SODIUM CHLORIDE 0.9 % IV BOLUS
1000.0000 mL | Freq: Once | INTRAVENOUS | Status: AC
  Administered 2022-09-18: 1000 mL via INTRAVENOUS

## 2022-09-18 NOTE — ED Provider Notes (Signed)
[facility] EMERGENCY DEPARTMENT Provider Note   CSN: [id number] Arrival date & time: 09/18/22  1143     History  Chief Complaint  Patient presents with   Abdominal Pain    Debra [name] is a 41 y.o. female.  Patient here with right upper quadrant abdominal pain for the last 12 hours.  Ate chicken last night.  Denies any diarrhea except for maybe 1 episode.  She had some nausea and vomiting.  Pain has been constant.  Nothing makes it worse or better.  History of hypertension.  History of hyper cholesterol.  Has not had pain like this before.  Denies any alcohol or drug use.  Denies any fevers or chills.  Denies any chest pain or shortness of breath.  No weakness or numbness or tingling.  The history is provided by the patient.       Home Medications Prior to Admission medications   Medication Sig Start Date End Date Taking? Authorizing Provider  ondansetron (ZOFRAN) 4 MG tablet Take 1 tablet (4 mg total) by mouth every 6 (six) hours. 09/18/22  Yes Micael Barb, DO  oxyCODONE (ROXICODONE) 5 MG immediate release tablet Take 1 tablet (5 mg total) by mouth every 6 (six) hours as needed for up to 10 doses for breakthrough pain. 09/18/22  Yes Eloisa Chokshi, DO  pantoprazole (PROTONIX) 20 MG tablet Take 1 tablet (20 mg total) by mouth daily for 14 days. 09/18/22 10/02/22 Yes Rowen Wilmer, DO  albuterol (VENTOLIN HFA) 108 (90 Base) MCG/ACT inhaler Inhale 2 puffs into the lungs every 6 (six) hours as needed for wheezing or shortness of breath (Cough). Fill upon patient request 08/25/22   Glenford Bayley, NP  fexofenadine (ALLEGRA) 180 MG tablet Take 1 tablet (180 mg total) by mouth daily. 02/09/22 08/08/22  Theadora Rama Scales, PA-C  fluticasone (FLONASE) 50 MCG/ACT nasal spray Place 1 spray into both nostrils daily. Begin by using 2 sprays in each nare daily for 3 to 5 days, then decrease to 1 spray in each nare daily. 02/09/22   Theadora Rama Scales, PA-C  Fluticasone  Furoate (ARNUITY ELLIPTA) 100 MCG/ACT AEPB Inhale 1 puff into the lungs daily. 08/25/22   Glenford Bayley, NP  hydrochlorothiazide (MICROZIDE) 12.5 MG capsule Take 12.5 mg by mouth daily. 11/23/21   [provider]  Multiple Vitamin (MULTI-VITAMIN DAILY PO) Multi Vitamin    [provider]  sertraline (ZOLOFT) 25 MG tablet Take 25 mg by mouth daily.    [provider]  Turmeric 400 MG CAPS Take by mouth.    [provider]      Allergies    Patient has no known allergies.    Review of Systems   Review of Systems  Physical Exam Updated Vital Signs BP (!) 146/88 (BP Location: Left Arm)   Pulse 73   Temp 98.8 F (37.1 C) (Oral)   Resp (!) 22   Ht 5\' 3"  (1.6 m)   Wt 136.1 kg   LMP 09/11/2022   SpO2 100%   BMI 53.14 kg/m  Physical Exam Vitals and nursing note reviewed.  Constitutional:      General: She is not in acute distress.    Appearance: She is well-developed. She is not ill-appearing.  HENT:     Head: Normocephalic and atraumatic.  Eyes:     Conjunctiva/sclera: Conjunctivae normal.  Cardiovascular:     Rate and Rhythm: Normal rate and regular rhythm.     Heart sounds: Normal heart sounds. No  murmur heard. Pulmonary:     Effort: Pulmonary effort is normal. No respiratory distress.     Breath sounds: Normal breath sounds.  Abdominal:     General: Abdomen is flat.     Palpations: Abdomen is soft.     Tenderness: There is abdominal tenderness in the right upper quadrant.  Musculoskeletal:        General: No swelling.     Cervical back: Neck supple.  Skin:    General: Skin is warm and dry.     Capillary Refill: Capillary refill takes less than 2 seconds.  Neurological:     Mental Status: She is alert.  Psychiatric:        Mood and Affect: Mood normal.     ED Results / Procedures / Treatments   Labs (all labs ordered are listed, but only abnormal results are displayed) Labs Reviewed  URINALYSIS, ROUTINE W REFLEX  MICROSCOPIC - Abnormal; Notable for the following components:      Result Value   Protein, ur >=300 (*)    All other components within normal limits  CBC WITH DIFFERENTIAL/PLATELET - Abnormal; Notable for the following components:   WBC 12.7 (*)    Platelets 428 (*)    Neutro Abs 9.8 (*)    Abs Immature Granulocytes 0.10 (*)    All other components within normal limits  COMPREHENSIVE METABOLIC PANEL - Abnormal; Notable for the following components:   Glucose, Bld 128 (*)    All other components within normal limits  URINALYSIS, MICROSCOPIC (REFLEX) - Abnormal; Notable for the following components:   Bacteria, UA FEW (*)    All other components within normal limits  PREGNANCY, URINE  LIPASE, BLOOD    EKG None  Radiology CT ABDOMEN PELVIS W CONTRAST  Result Date: 09/18/2022 CLINICAL DATA:  Evaluate for bowel obstruction.  Gallstones. EXAM: CT ABDOMEN AND PELVIS WITH CONTRAST TECHNIQUE: Multidetector CT imaging of the abdomen and pelvis was performed using the standard protocol following bolus administration of intravenous contrast. RADIATION DOSE REDUCTION: This exam was performed according to the departmental dose-optimization program which includes automated exposure control, adjustment of the mA and/or kV according to patient size and/or use of iterative reconstruction technique. CONTRAST:  OMNIPAQUE IOHEXOL 300 MG/ML  SOLN COMPARISON:  Ultrasound 09/18/2022 FINDINGS: Lower chest: No acute abnormality. Hepatobiliary: Subjective hepatic steatosis corresponds with the ultrasound findings. No focal liver lesion identified. Gallstones identified within the dependent portion of the gallbladder. No wall thickening or pericholecystic inflammation. No bile duct dilatation identified. Pancreas: Unremarkable. No pancreatic ductal dilatation or surrounding inflammatory changes. Spleen: Normal in size without focal abnormality. Adrenals/Urinary Tract: Adrenal glands are unremarkable. Kidneys  are normal, without renal calculi, focal lesion, or hydronephrosis. Bladder is unremarkable. Stomach/Bowel: Stomach appears normal. The appendix is visualized and is within normal limits. There is no bowel wall thickening, inflammation, or distension. No evidence for bowel obstruction. Vascular/Lymphatic: No significant vascular findings are present. No enlarged abdominal or pelvic lymph nodes. Reproductive: Uterus and bilateral adnexa are unremarkable. Other: No free fluid or fluid collections identified. No signs of pneumoperitoneum. Small fat containing umbilical hernia. Musculoskeletal: No acute or significant osseous findings. IMPRESSION: 1. No acute findings within the abdomen or pelvis. No signs of bowel obstruction. 2. Gallstones. 3. Hepatic steatosis. Electronically Signed   By: Signa Kell M.D.   On: 09/18/2022 13:48   US Abdomen Limited RUQ (LIVER/GB)  Result Date: 09/18/2022 CLINICAL DATA:  Right upper quadrant pain.  Nausea and vomiting. EXAM: ULTRASOUND ABDOMEN LIMITED  RIGHT UPPER QUADRANT COMPARISON:  None Available. FINDINGS: Gallbladder: At least 2 dependent gallstones. Gallbladder mild to moderately distended. No wall thickening or pericholecystic fluid. No sonographic Murphy's sign. Common bile duct: Diameter: Dilated to approximally to 8 mm. Possible common bile duct stone near the expected confluence with the cystic duct. No convincing intrahepatic bile duct dilation. Liver: Increased parenchymal echogenicity with poor through transmission of the sound beam limiting liver assessment. Grossly normal in size. No visualized mass. Portal vein is patent on color Doppler imaging with normal direction of blood flow towards the liver. Other: None. IMPRESSION: 1. Study somewhat limited due to difficulty penetrating the liver. 2. Possible stone within the common bile duct. Common bile duct measures 8 mm peripheral to this apparent stone. Consider follow-up MRCP or ERCP for further assessment. 3.  Dependent gallstones, but no evidence of acute cholecystitis. 4. Marked increased liver parenchymal echogenicity consistent with hepatic steatosis. Electronically Signed   By: Amie Portlandavid  Ormond M.D.   On: 09/18/2022 13:06    Procedures Procedures    Medications Ordered in ED Medications  sodium chloride 0.9 % bolus 1,000 mL (1,000 mLs Intravenous New Bag/Given 09/18/22 1250)  ondansetron (ZOFRAN) injection 4 mg (4 mg Intravenous Given 09/18/22 1251)  fentaNYL (SUBLIMAZE) injection 50 mcg (50 mcg Intravenous Given 09/18/22 1253)  morphine (PF) 4 MG/ML injection 4 mg (4 mg Intravenous Given 09/18/22 1358)  iohexol (OMNIPAQUE) 300 MG/ML solution 100 mL (100 mLs Intravenous Contrast Given 09/18/22 1321)    ED Course/ Medical Decision Making/ A&P                           Medical Decision Making Amount and/or Complexity of Data Reviewed Labs: ordered. Radiology: ordered.  Risk Prescription drug management.   Garen LahBettina Longnecker is here with abdominal pain.  Mildly hypertensive upon arrival but after pain medication blood pressure is 137/78.  Vital signs otherwise unremarkable.  History of high cholesterol hypertension.  Patient with right upper quadrant abdominal pain for the last 12 hours after food last night.  Differential diagnosis likely biliary colic versus cholecystitis versus pancreatitis.  Could be gastritis.  I have no concern for appendicitis or bowel obstruction at this time.  We will get CBC, CMP, lipase, urinalysis, right upper quadrant ultrasound.  Patient given IV fentanyl, IV Zofran and IV fluids.  Per my review and interpretation of labs is no significant leukocytosis or anemia or electrolyte abnormality.  Lipase is normal.  Gallbladder and liver enzymes within normal limits.  Ultrasound overall with limited views.  May be a stone in the common bile duct, common bile duct 8 mm.  There are dependent gallstones but no evidence of cholecystitis in the gallbladder.  Overall talked with  Dr. Bosie ClosSchooler with gastroenterology who recommended getting a CT scan to get some more detail.  Patient is feeling better on my reevaluation.  CT scan showed no bile duct dilation.  Gallstones within the gallbladder but there is no evidence of cholecystitis or pancreatitis or gallbladder wall thickening as well.  Overall she is feeling much better.  Her enzymes including her lipase and gallbladder enzymes are normal.  She has no fever.  I suspect this is an attack of biliary colic.  Could be some gastritis symptoms as well.  Will prescribe Roxicodone, Zofran, Protonix and have her follow-up with general surgery.  She understands return precautions including fever with pain, worsening pain nausea and vomiting.  She feels better and feels comfortable with discharge  and she prefers to follow-up outpatient.  This chart was dictated using voice recognition software.  Despite best efforts to proofread,  errors can occur which can change the documentation meaning.         Final Clinical Impression(s) / ED Diagnoses Final diagnoses:  Gallstones    Rx / DC Orders ED Discharge Orders          Ordered    ondansetron (ZOFRAN) 4 MG tablet  Every 6 hours        09/18/22 1402    pantoprazole (PROTONIX) 20 MG tablet  Daily        09/18/22 1402    oxyCODONE (ROXICODONE) 5 MG immediate release tablet  Every 6 hours PRN        09/18/22 1402              Virgina Norfolk, DO 09/18/22 1402

## 2022-09-18 NOTE — Discharge Instructions (Addendum)
Suspect your pain is from gallstones.  Please try to avoid foods high in fat and protein.  Recommend mostly liquid diet and toast and crackers diet for the next 24 hours.  Follow-up with general surgery.  Please return if you develop worsening pain, fever, uncontrollable nausea and vomiting.  I have written you for narcotic pain medicine for home called oxycodone.  Do not mix with alcohol or any other drugs as this medication is sedating.  I have also written you for nausea medicine called Zofran and medicine to help with stomach inflammation called Protonix.

## 2022-09-18 NOTE — ED Notes (Signed)
ED Provider at bedside. 

## 2022-09-18 NOTE — ED Triage Notes (Signed)
Pt arrives pov, slow gait, c/o epigastric pain and n/vd since midnight. Referred by UC d/t symptoms. Pt was given zofran at [facility] and vomited

## 2023-01-13 ENCOUNTER — Telehealth: Payer: Self-pay | Admitting: Primary Care

## 2023-01-13 NOTE — Telephone Encounter (Signed)
Can she use inhaler if she is pos for Covid? Pls all to advise @ [phone]

## 2023-01-13 NOTE — Telephone Encounter (Signed)
Patient is checking on message left. Patient phone number is [phone].

## 2023-01-14 NOTE — Telephone Encounter (Signed)
Pt ret call. Pls try again.

## 2023-01-14 NOTE — Telephone Encounter (Signed)
Attempted to call pt but unable to reach. Left message to return call.  

## 2023-01-14 NOTE — Telephone Encounter (Signed)
Called and spoke with patient. Advised patient that she could still use her inhaler even with having covid. She verbalized understanding.   Nothing further needed.

## 2023-06-13 ENCOUNTER — Encounter: Payer: Self-pay | Admitting: Primary Care

## 2023-06-13 ENCOUNTER — Ambulatory Visit: Payer: Commercial Managed Care - PPO | Admitting: Primary Care

## 2023-06-13 VITALS — BP 142/84 | HR 92 | Temp 98.2°F | Ht 63.0 in | Wt 308.2 lb

## 2023-06-13 DIAGNOSIS — Z01811 Encounter for preprocedural respiratory examination: Secondary | ICD-10-CM | POA: Diagnosis not present

## 2023-06-13 DIAGNOSIS — J45991 Cough variant asthma: Secondary | ICD-10-CM

## 2023-06-13 NOTE — Assessment & Plan Note (Signed)
-   Continue PRN flonase and Allegra 180mg

## 2023-06-13 NOTE — Assessment & Plan Note (Signed)
-   Asthma is currently well-controlled. Patient will be considered low risk for mechanical ventilation and/or postop pulmonary complications.

## 2023-06-13 NOTE — Patient Instructions (Signed)
Low risk for prolonged mechanical ventilation and/or postop pulmonary complication  Continue Arnuity as needed, if you develop acute respiratory symptoms prior to surgery notify our office   Recommend 1. Short duration of surgery as much as possible and avoid paralytic if possible 2. Recovery in step down or ICU with Pulmonary consultation if needed  3. DVT prophylaxis 4. Aggressive pulmonary toilet with o2, bronchodilatation, and incentive spirometry and early ambulation  Follow-up 1 year or sooner if needed

## 2023-06-13 NOTE — Assessment & Plan Note (Addendum)
-   Stable; No recent exacerbations. No active respiratory symptoms or cough. No SABA need - Continue Arnuity one puff daily prn asthma symptoms - Follow-up in 6 months

## 2023-06-13 NOTE — Progress Notes (Signed)
@Patient  ID: Debra [name], female    DOB: 05-28-1981, 42 y.o.   MRN: [id number]  Chief Complaint  Patient presents with   Follow-up    Surgical clearance visit    Referring provider: Maud Deed, PA  HPI: 42 year old female, former smoker. PMH significant upper airway cough, GERD. Former patient of Dr. Chestine Spore.   Previous [facility] pulmonary encounter: 12/15/2021 Patient presents today for 1 year follow-up/acute visit. She has had a cough for 6 weeks, started in December 2022. She went to UC and was treated with prednisone which helped some but cough returned shortly after completing. She has been taking Zyrtec and Pepcid regularly. Thinks there may be "something" triggering her symptoms coming from air ducks in her home, getting this check out next week. She is originally from [place] originally, has been her for about 7 years. She believes her dad had hyperreactive airways disease. Denies f/c/s, sob, chest tightness or wheezing.   02/23/2022 Patient presents today for 4-6 week follow-up with PFTs. During her last visit we started her on Arnuity and added flonase nasal spray prn. PFTs and FENO were normal. She did have increased diffusion defect on pulmonary function testing, concern for left sided heart failure. No leg swelling. BNP was normal today on lab work. She went to ED on 4/4 for acute bronchitis, treated with Augmentin. Given trelegy for 2 weeks but did not pick this up. She has been using Arnuity, starting to taper use. She is compliant with Allegra and flonase for allergies. Still has some left sided ear congestion. Cough has improved, states that it is not as persistent.   08/25/2022  Patient presents today for 6 month follow-up. She has no current respiratory symptoms at this time She feels Arnuity has helped her cough significantly She has has two colds since her last visit, symptoms only lasted for 2 days and did not turn into asthma flare She uses Arnuity as needed  only  She has not needed to use albuterol but keeps inhaler with her  She uses flonase as needed and takes allegra during allergy season Denies shortness of breath, wheezing, chest tightness or cough    06/13/2023- Interim hx  Patient presents today for 6 month follow-up/ cough variant asthma and pre-op risk assessment. She is planning for upcoming bariatric surgery with Dr. Cedric Fishman, date to be determined. Asthma is well controlled. No active cough. No recent respiratory infections. She use Arnuity as needed, has not needed in 6 months. No albuterol use. No history of sleep apnea. She does not use CPAP or wear oxygen.   Pulmonary function testing:  02/22/22 PFTs>> FVC 2.98 (84%), FEV1 2.76 (96%), ratio 93, TLC 95%, DLCOunc 26.43 (127)   No Known Allergies  Immunization History  Administered Date(s) Administered   Influenza Inj Mdck Quad Pf 07/29/2018   Influenza Inj Mdck Quad With Preservative 10/18/2017   Influenza,inj,Quad PF,6+ Mos 07/09/2020, 08/25/2022   Influenza,inj,Quad PF,6-35 Mos 07/16/2019   Influenza-Unspecified 10/18/2017, 07/09/2021   PFIZER(Purple Top)SARS-COV-2 Vaccination 01/19/2020, 02/20/2020, 09/15/2020   Pfizer Covid-19 Vaccine Bivalent Booster 66yrs & up 09/11/2021   Tdap 03/14/2019, 04/14/2019    Past Medical History:  Diagnosis Date   Abnormal Pap smear of cervix 01-16-14   -pap nl but Pos.HR HPV/Colpo 02-20-14--LSIL--needs repeat pap 54yr.   Anxiety    in therapy again 10/2017   Hypercholesterolemia 2016   Hypertension    Miscarriage 05/14/2018   Pregnancy induced hypertension     Tobacco History: Social History  Tobacco Use  Smoking Status Former   Current packs/day: 0.00   Types: Cigarettes   Quit date: 05/14/2009   Years since quitting: 14.0  Smokeless Tobacco Never  Tobacco Comments   2-3 a day, 1 pack per week   Counseling given: Not Answered Tobacco comments: 2-3 a day, 1 pack per week   Outpatient Medications Prior to Visit  Medication  Sig Dispense Refill   fexofenadine (ALLEGRA) 180 MG tablet Take 1 tablet (180 mg total) by mouth daily. 90 tablet 1   fluticasone (FLONASE) 50 MCG/ACT nasal spray Place 1 spray into both nostrils daily. Begin by using 2 sprays in each nare daily for 3 to 5 days, then decrease to 1 spray in each nare daily. 32 mL 1   Fluticasone Furoate (ARNUITY ELLIPTA) 100 MCG/ACT AEPB Inhale 1 puff into the lungs daily. 30 each 5   hydrochlorothiazide (MICROZIDE) 12.5 MG capsule Take 12.5 mg by mouth daily.     Multiple Vitamin (MULTI-VITAMIN DAILY PO) Multi Vitamin     sertraline (ZOLOFT) 50 MG tablet Take 50 mg by mouth daily.     Turmeric 400 MG CAPS Take by mouth.     albuterol (VENTOLIN HFA) 108 (90 Base) MCG/ACT inhaler Inhale 2 puffs into the lungs every 6 (six) hours as needed for wheezing or shortness of breath (Cough). Fill upon patient request (Patient not taking: Reported on 06/13/2023) 18 g 2   ondansetron (ZOFRAN) 4 MG tablet Take 1 tablet (4 mg total) by mouth every 6 (six) hours. (Patient not taking: Reported on 06/13/2023) 12 tablet 0   oxyCODONE (ROXICODONE) 5 MG immediate release tablet Take 1 tablet (5 mg total) by mouth every 6 (six) hours as needed for up to 10 doses for breakthrough pain. (Patient not taking: Reported on 06/13/2023) 10 tablet 0   pantoprazole (PROTONIX) 20 MG tablet Take 1 tablet (20 mg total) by mouth daily for 14 days. (Patient not taking: Reported on 06/13/2023) 14 tablet 0   sertraline (ZOLOFT) 25 MG tablet Take 50 mg by mouth daily. 50 mg daily (Patient not taking: Reported on 06/13/2023)     No facility-administered medications prior to visit.   Review of Systems  Review of Systems  Constitutional: Negative.   HENT: Negative.    Respiratory: Negative.  Negative for cough, shortness of breath and wheezing.   Cardiovascular: Negative.      Physical Exam  BP (!) 142/84 (BP Location: Right Arm, Patient Position: Sitting, Cuff Size: Large)   Pulse 92   Temp 98.2 F (36.8  C) (Oral)   Ht 5\' 3"  (1.6 m)   Wt (!) 308 lb 3.2 oz (139.8 kg)   SpO2 98%   BMI 54.60 kg/m  Physical Exam Constitutional:      Appearance: Normal appearance. She is obese.  HENT:     Head: Normocephalic and atraumatic.     Mouth/Throat:     Mouth: Mucous membranes are moist.     Pharynx: Oropharynx is clear.  Cardiovascular:     Rate and Rhythm: Normal rate and regular rhythm.  Pulmonary:     Effort: Pulmonary effort is normal.     Breath sounds: Normal breath sounds.  Musculoskeletal:        General: Normal range of motion.  Skin:    General: Skin is warm and dry.  Neurological:     General: No focal deficit present.     Mental Status: She is alert and oriented to person, place, and time. Mental status  is at baseline.  Psychiatric:        Mood and Affect: Mood normal.        Behavior: Behavior normal.        Thought Content: Thought content normal.        Judgment: Judgment normal.      Lab Results:  CBC    Component Value Date/Time   WBC 12.7 (H) 09/18/2022 1210   RBC 4.51 09/18/2022 1210   HGB 13.4 09/18/2022 1210   HCT 40.2 09/18/2022 1210   PLT 428 (H) 09/18/2022 1210   MCV 89.1 09/18/2022 1210   MCH 29.7 09/18/2022 1210   MCHC 33.3 09/18/2022 1210   RDW 13.1 09/18/2022 1210   LYMPHSABS 2.1 09/18/2022 1210   MONOABS 0.5 09/18/2022 1210   EOSABS 0.1 09/18/2022 1210   BASOSABS 0.1 09/18/2022 1210    BMET    Component Value Date/Time   NA 136 09/18/2022 1210   K 4.1 09/18/2022 1210   CL 102 09/18/2022 1210   CO2 25 09/18/2022 1210   GLUCOSE 128 (H) 09/18/2022 1210   BUN 15 09/18/2022 1210   CREATININE 0.82 09/18/2022 1210   CALCIUM 8.9 09/18/2022 1210   GFRNONAA >60 09/18/2022 1210   GFRAA >60 06/07/2019 1024    BNP No results found for: "BNP"  ProBNP    Component Value Date/Time   PROBNP 18.0 02/23/2022 1100    Imaging: No results found.   Assessment & Plan:   Cough variant asthma - Stable; No recent exacerbations. No active  respiratory symptoms or cough. No SABA need - Continue Arnuity one puff daily prn asthma symptoms - Follow-up in 6 months   Allergic rhinitis, seasonal - Continue PRN flonase and Allegra 180mg    Encounter for pre-operative respiratory clearance - Asthma is currently well-controlled. Patient will be considered low risk for mechanical ventilation and/or postop pulmonary complications.     1) RISK FOR PROLONGED MECHANICAL VENTILAION - > 48h  1A) Arozullah - Prolonged mech ventilation risk Arozullah Postperative Pulmonary Risk Score - for mech ventilation dependence >48h USAA, Ann Surg 2000, major non-cardiac surgery) Comment Score  Type of surgery - abd ao aneurysm (27), thoracic (21), neurosurgery / upper abdominal / vascular (21), neck (11) Bariatric  13  Emergency Surgery - (11)  0  ALbumin < 3 or poor nutritional state - (9)  0  BUN > 30 -  (8)  0  Partial or completely dependent functional status - (7)  0  COPD -  (6)  0  Age - 60 to 69 (4), > 70  (6)  0  TOTAL  13  Risk Stratifcation scores  - < 10 (0.5%), 11-19 (1.8%), 20-27 (4.2%), 28-40 (10.1%), >40 (26.6%)  1.8% risk prolonged mech ventilation      1B) GUPTA - Prolonged Mech Vent Risk Score source Risk  Guptal post op prolonged mech ventilation > 48h or reintubation < 30 days - ACS 2007-2008 dataset - [email or web address] 0.0 % Risk of mechanical ventilation for >48 hrs after surgery, or unplanned intubation ?30 days of surgery    2) RISK FOR POST OP PNEUMONIA Score source Risk  Chales Abrahams - Post Op Pnemounia risk  [email or web address] 0.0 % Risk of postoperative pneumonia     R3) ISK FOR ANY POST-OP PULMONARY COMPLICATION Score source Risk  CANET/ARISCAT Score - risk for ANY/ALl pulmonary complications - > risk of in-hospital post-op pulmonary complications (composite including respiratory failure, respiratory infection,  pleural effusion, atelectasis, pneumothorax, bronchospasm, aspiration pneumonitis) [employer] -  based on age, anemia, pulse ox, resp infection prior 30d, incision site, duration of surgery, and emergency v elective surgery Low risk 1.6% risk of in-hospital post-op pulmonary complications (composite including respiratory failure, respiratory infection, pleural effusion, atelectasis, pneumothorax, bronchospasm, aspiration pneumonitis)       Glenford Bayley, NP 06/13/2023

## 2023-10-17 ENCOUNTER — Other Ambulatory Visit: Payer: Self-pay | Admitting: Primary Care

## 2024-07-24 ENCOUNTER — Other Ambulatory Visit: Payer: Self-pay | Admitting: Obstetrics and Gynecology

## 2024-07-24 DIAGNOSIS — R928 Other abnormal and inconclusive findings on diagnostic imaging of breast: Secondary | ICD-10-CM

## 2024-08-01 ENCOUNTER — Ambulatory Visit
Admission: RE | Admit: 2024-08-01 | Discharge: 2024-08-01 | Disposition: A | Discharge status: Home / Self Care | Source: Ambulatory Visit | Attending: Obstetrics and Gynecology | Admitting: Obstetrics and Gynecology

## 2024-08-01 DIAGNOSIS — R928 Other abnormal and inconclusive findings on diagnostic imaging of breast: Secondary | ICD-10-CM

## 2024-08-24 ENCOUNTER — Ambulatory Visit
Admission: EM | Admit: 2024-08-24 | Discharge: 2024-08-24 | Disposition: A | Discharge status: Home / Self Care | Attending: Emergency Medicine | Admitting: Emergency Medicine

## 2024-08-24 ENCOUNTER — Other Ambulatory Visit: Payer: Self-pay

## 2024-08-24 DIAGNOSIS — R52 Pain, unspecified: Secondary | ICD-10-CM

## 2024-08-24 DIAGNOSIS — U071 COVID-19: Secondary | ICD-10-CM

## 2024-08-24 LAB — POC COVID19/FLU A&B COMBO
Covid Antigen, POC: POSITIVE — AB
Influenza A Antigen, POC: NEGATIVE
Influenza B Antigen, POC: NEGATIVE

## 2024-08-24 NOTE — ED Provider Notes (Signed)
 [facility]    CSN: [id number] Arrival date & time: 08/24/24  1300      History   Chief Complaint Chief Complaint  Patient presents with   Cough   Generalized Body Aches    Fever this morning (103 F), headaches, chest congestion.     HPI Debra [name] is a 43 y.o. female.   Patient presents to clinic with husband and daughter.  Since last night patient has had a cough, headache, fever, generalized bodyaches, joint pains and chest congestion.  Exposed to COVID 3 days ago.  Did have a fever this morning, took Tylenol.  This helped bodyaches as well.  Daughter is sick with similar symptoms, started a few days earlier.  Patient suffers from anxiety.  Feels like she has been anxious and this makes her short of breath.  Has not been wheezing.  Has not had abnormal diarrhea, did have gastric surgery and will have soft stools.  Without vomiting.  The history is provided by the patient and medical records.  Cough   Past Medical History:  Diagnosis Date   Abnormal Pap smear of cervix 01-16-14   -pap nl but Pos.HR HPV/Colpo 02-20-14--LSIL--needs repeat pap 1yr.   Anxiety    in therapy again 10/2017   Hypercholesterolemia 2016   Hypertension    Miscarriage 05/14/2018   Pregnancy induced hypertension     Patient Active Problem List   Diagnosis Date Noted   Encounter for pre-operative respiratory clearance 06/13/2023   Allergic rhinitis, seasonal 08/25/2022   Ear congestion, left 02/24/2022   Dyspnea on exertion 02/24/2022   Cough variant asthma 12/17/2021   GERD (gastroesophageal reflux disease) 12/12/2020   Upper airway cough syndrome 11/20/2020   Breech presentation of fetus 06/07/2019   Cesarean delivery delivered 06/07/2019   Anxiety 04/12/2018   Pain in joint of right shoulder 11/22/2017   History of arthroscopic procedure on shoulder 11/22/2017    Past Surgical History:  Procedure Laterality Date   CARPAL TUNNEL RELEASE     CESAREAN SECTION N/A  06/07/2019   Procedure: CESAREAN SECTION;  Surgeon: Marget Lenis, MD;  Location: [facility] ORS;  Service: Obstetrics;  Laterality: N/A;  Primary edc 8/17 NKDA need RNFA   COLPOSCOPY     ROTATOR CUFF REPAIR Right 07/2017   Dr.Norris--[facility]--bone spur/arthritis   TOE SURGERY Bilateral    -as child--in [place]   TONSILLECTOMY     TONSILLECTOMY AND ADENOIDECTOMY     -age 6    OB History     Gravida  2   Para      Term      Preterm      AB  1   Living         SAB  1   IAB      Ectopic      Multiple      Live Births               Home Medications    Prior to Admission medications   Medication Sig Start Date End Date Taking? Authorizing Provider  albuterol  (VENTOLIN  HFA) 108 (90 Base) MCG/ACT inhaler Inhale 2 puffs into the lungs every 6 (six) hours as needed for wheezing or shortness of breath (Cough). Fill upon patient request Patient not taking: Reported on 06/13/2023 08/25/22   Hope Almarie ORN, NP  ARNUITY ELLIPTA  100 MCG/ACT AEPB INHALE 1 PUFF INTO THE LUNGS DAILY 10/20/23   Hope Almarie ORN, NP  fexofenadine  (ALLEGRA ) 180 MG tablet Take  1 tablet (180 mg total) by mouth daily. 02/09/22 06/13/23  Joesph Shaver Scales, PA-C  fluticasone  (FLONASE ) 50 MCG/ACT nasal spray Place 1 spray into both nostrils daily. Begin by using 2 sprays in each nare daily for 3 to 5 days, then decrease to 1 spray in each nare daily. 02/09/22   Joesph Shaver Scales, PA-C  hydrochlorothiazide (MICROZIDE) 12.5 MG capsule Take 12.5 mg by mouth daily. 11/23/21   [provider]  Multiple Vitamin (MULTI-VITAMIN DAILY PO) Multi Vitamin    [provider]  ondansetron  (ZOFRAN ) 4 MG tablet Take 1 tablet (4 mg total) by mouth every 6 (six) hours. Patient not taking: Reported on 06/13/2023 09/18/22   Ruthe Cornet, DO  oxyCODONE  (ROXICODONE ) 5 MG immediate release tablet Take 1 tablet (5 mg total) by mouth every 6 (six) hours as needed for up to 10 doses for breakthrough  pain. Patient not taking: Reported on 06/13/2023 09/18/22   Ruthe Cornet, DO  pantoprazole  (PROTONIX ) 20 MG tablet Take 1 tablet (20 mg total) by mouth daily for 14 days. Patient not taking: Reported on 06/13/2023 09/18/22 10/02/22  Ruthe Cornet, DO  sertraline  (ZOLOFT ) 25 MG tablet Take 50 mg by mouth daily. 50 mg daily Patient not taking: Reported on 06/13/2023    [provider]  sertraline  (ZOLOFT ) 50 MG tablet Take 50 mg by mouth daily.    [provider]  Turmeric 400 MG CAPS Take by mouth.    [provider]    Family History Family History  Problem Relation Age of Onset   Thyroid disease Mother    Osteoarthritis Mother    Hypertension Father    Hypertension Brother    Rheum arthritis Maternal Grandmother     Social History Social History   Tobacco Use   Smoking status: Former    Current packs/day: 0.00    Types: Cigarettes    Quit date: 05/14/2009    Years since quitting: 15.2   Smokeless tobacco: Never   Tobacco comments:    2-3 a day, 1 pack per week  Vaping Use   Vaping status: Never Used  Substance Use Topics   Alcohol use: Not Currently   Drug use: No     Allergies   Patient has no known allergies.   Review of Systems Review of Systems  Per HPI  Physical Exam Triage Vital Signs ED Triage Vitals  Encounter Vitals Group     BP 08/24/24 1330 118/77     Girls Systolic BP Percentile --      Girls Diastolic BP Percentile --      Boys Systolic BP Percentile --      Boys Diastolic BP Percentile --      Pulse Rate 08/24/24 1330 (!) 102     Resp 08/24/24 1330 17     Temp 08/24/24 1330 98.2 F (36.8 C)     Temp Source 08/24/24 1330 Oral     SpO2 08/24/24 1330 97 %     Weight 08/24/24 1329 188 lb (85.3 kg)     Height 08/24/24 1329 5' 3 (1.6 m)     Head Circumference --      Peak Flow --      Pain Score 08/24/24 1328 5     Pain Loc --      Pain Education --      Exclude from Growth Chart --    No data found.  Updated  Vital Signs BP 118/77 (BP Location: Right Arm)   Pulse ROLLEN)  102   Temp 98.2 F (36.8 C) (Oral)   Resp 17   Ht 5' 3 (1.6 m)   Wt 188 lb (85.3 kg)   LMP 08/15/2024 (Exact Date)   SpO2 97%   Breastfeeding No   BMI 33.30 kg/m   Visual Acuity Right Eye Distance:   Left Eye Distance:   Bilateral Distance:    Right Eye Near:   Left Eye Near:    Bilateral Near:     Physical Exam Vitals and nursing note reviewed.  Constitutional:      Appearance: Normal appearance.  HENT:     Head: Normocephalic and atraumatic.     Right Ear: External ear normal.     Left Ear: External ear normal.     Nose: Congestion present.     Mouth/Throat:     Mouth: Mucous membranes are moist.     Pharynx: Posterior oropharyngeal erythema present.  Eyes:     Conjunctiva/sclera: Conjunctivae normal.  Cardiovascular:     Rate and Rhythm: Normal rate and regular rhythm.     Heart sounds: Normal heart sounds. No murmur heard. Pulmonary:     Effort: Pulmonary effort is normal. No respiratory distress.     Breath sounds: No wheezing.  Skin:    General: Skin is warm and dry.  Neurological:     General: No focal deficit present.     Mental Status: She is alert and oriented to person, place, and time.  Psychiatric:        Mood and Affect: Mood normal.        Behavior: Behavior normal.      UC Treatments / Results  Labs (all labs ordered are listed, but only abnormal results are displayed) Labs Reviewed  POC COVID19/FLU A&B COMBO - Abnormal; Notable for the following components:      Result Value   Covid Antigen, POC Positive (*)    All other components within normal limits    EKG   Radiology No results found.  Procedures Procedures (including critical care time)  Medications Ordered in UC Medications - No data to display  Initial Impression / Assessment and Plan / UC Course  I have reviewed the triage vital signs and the nursing notes.  Pertinent labs & imaging results that were  available during my care of the patient were reviewed by me and considered in my medical decision making (see chart for details).  Vitals and triage reviewed, patient is hemodynamically stable.  Lungs vesicular, heart with regular rate and rhythm.  Congestion and postnasal drip present on physical exam.  POC COVID and flu testing positive for COVID-19.  Patient within window for Paxlovid and meets high risk criteria of obesity.  Offered Paxlovid to patient, normal renal function on previous labs, patient reports previous COVID-19 infections have been mild so she will withhold.  This is agreeable.  Symptomatic management for viral illness discussed.  Plan of care, follow-up care return precautions given, no questions at this time.     Final Clinical Impressions(s) / UC Diagnoses   Final diagnoses:  Generalized body aches  COVID-19 virus infection     Discharge Instructions      You tested positive for COVID-19 today in clinic.  This is a viral illness that typically last 5 to 7 days in duration.  Please alternate between 600 mg of ibuprofen  and 500 mg of Tylenol every 4-6 hours to help with fever, body aches, headaches and chills.  Ensure you are staying well-hydrated with  at least 64 ounces of water daily.  Seek immediate care or return to clinic if you develop any worsening symptoms, or prolonged symptoms.   ED Prescriptions   None    PDMP not reviewed this encounter.   Dreama Chryl SAILOR, FNP 08/24/24 1434

## 2024-08-24 NOTE — ED Triage Notes (Addendum)
 Pt presents with complaints of cough, headaches, fevers, generalized body aches, and chest congestion. Symptoms began last night. Was exposed to COVID three days ago. Currently rates overall pain a 5/10. Temperature this morning at home was 103 F in ear. OTC Tylenol taken with improvement in body aches. Child is sick with similar symptoms.

## 2024-08-24 NOTE — Discharge Instructions (Signed)
 You tested positive for COVID-19 today in clinic.  This is a viral illness that typically last 5 to 7 days in duration.  Please alternate between 600 mg of ibuprofen  and 500 mg of Tylenol every 4-6 hours to help with fever, body aches, headaches and chills.  Ensure you are staying well-hydrated with at least 64 ounces of water daily.  Seek immediate care or return to clinic if you develop any worsening symptoms, or prolonged symptoms.
